# Patient Record
Sex: Female | Born: 1940 | Hispanic: No | Marital: Married | Smoking: Former smoker
Health system: Southern US, Community
[De-identification: ages and names within clinical notes are randomized; demographics above are authoritative.]

## PROBLEM LIST (undated history)

## (undated) DIAGNOSIS — I10 Essential (primary) hypertension: Secondary | ICD-10-CM

## (undated) DIAGNOSIS — E119 Type 2 diabetes mellitus without complications: Secondary | ICD-10-CM

---

## 2011-04-02 ENCOUNTER — Encounter: Payer: Self-pay | Admitting: Emergency Medicine

## 2011-04-02 ENCOUNTER — Emergency Department (HOSPITAL_COMMUNITY)
Admission: EM | Admit: 2011-04-02 | Discharge: 2011-04-02 | Disposition: A | Discharge status: Home / Self Care | Payer: Medicaid Other | Attending: Emergency Medicine | Admitting: Emergency Medicine

## 2011-04-02 DIAGNOSIS — F17200 Nicotine dependence, unspecified, uncomplicated: Secondary | ICD-10-CM | POA: Insufficient documentation

## 2011-04-02 DIAGNOSIS — R197 Diarrhea, unspecified: Secondary | ICD-10-CM | POA: Insufficient documentation

## 2011-04-02 LAB — DIFFERENTIAL
Eosinophils Absolute: 0.2 10*3/uL (ref 0.0–0.7)
Eosinophils Relative: 2 % (ref 0–5)
Lymphocytes Relative: 15 % (ref 12–46)
Lymphs Abs: 1.5 10*3/uL (ref 0.7–4.0)
Monocytes Absolute: 1.1 10*3/uL — ABNORMAL HIGH (ref 0.1–1.0)
Monocytes Relative: 11 % (ref 3–12)

## 2011-04-02 LAB — COMPREHENSIVE METABOLIC PANEL
ALT: 41 U/L — ABNORMAL HIGH (ref 0–35)
BUN: 22 mg/dL (ref 6–23)
CO2: 27 mEq/L (ref 19–32)
Calcium: 9.4 mg/dL (ref 8.4–10.5)
Creatinine, Ser: 0.67 mg/dL (ref 0.50–1.10)
GFR calc Af Amer: >90 mL/min (ref >90)
GFR calc non Af Amer: 88 mL/min — ABNORMAL LOW (ref >90)
Glucose, Bld: 113 mg/dL — ABNORMAL HIGH (ref 70–99)
Total Protein: 7.6 g/dL (ref 6.0–8.3)

## 2011-04-02 LAB — URINALYSIS, ROUTINE W REFLEX MICROSCOPIC
Hgb urine dipstick: NEGATIVE
Nitrite: NEGATIVE
Protein, ur: NEGATIVE mg/dL
Specific Gravity, Urine: 1.021 (ref 1.005–1.030)
Urobilinogen, UA: 0.2 mg/dL (ref 0.0–1.0)

## 2011-04-02 LAB — CBC
HCT: 40.9 % (ref 36.0–46.0)
MCH: 29.5 pg (ref 26.0–34.0)
MCV: 87.4 fL (ref 78.0–100.0)
Platelets: 265 10*3/uL (ref 150–400)
RBC: 4.68 MIL/uL (ref 3.87–5.11)
WBC: 9.7 10*3/uL (ref 4.0–10.5)

## 2011-04-02 LAB — LACTIC ACID, PLASMA: Lactic Acid, Venous: 1.6 mmol/L (ref 0.5–2.2)

## 2011-04-02 LAB — URINE MICROSCOPIC-ADD ON

## 2011-04-02 LAB — LIPASE, BLOOD: Lipase: 50 U/L (ref 11–59)

## 2011-04-02 MED ORDER — MORPHINE SULFATE 4 MG/ML IJ SOLN
4.0000 mg | Freq: Once | INTRAMUSCULAR | Status: AC
  Administered 2011-04-02: 4 mg via INTRAVENOUS
  Filled 2011-04-02: qty 1

## 2011-04-02 MED ORDER — SODIUM CHLORIDE 0.9 % IV SOLN
INTRAVENOUS | Status: DC
  Administered 2011-04-02: 13:00:00 via INTRAVENOUS

## 2011-04-02 MED ORDER — LOPERAMIDE HCL 2 MG PO CAPS: 2.0000 mg | ORAL_CAPSULE | Freq: Four times a day (QID) | ORAL | Status: AC | PRN

## 2011-04-02 NOTE — ED Provider Notes (Signed)
History     CSN: [id number] Arrival date & time: 04/02/2011 11:54 AM   First MD Initiated Contact with Patient 04/02/11 1242      Chief Complaint  Patient presents with  . Diarrhea    (Consider location/radiation/quality/duration/timing/severity/associated sxs/prior treatment) Patient is a 70 y.o. female presenting with diarrhea. The history is provided by the patient. No language interpreter was used.  Diarrhea The primary symptoms include abdominal pain (mild) and diarrhea. Primary symptoms do not include fever, fatigue, nausea, vomiting, melena, hematochezia or dysuria. The illness began 3 to 5 days ago. The onset was gradual. The problem has not changed since onset. The illness does not include chills, anorexia, constipation or back pain.    History reviewed. No pertinent past medical history.  History reviewed. No pertinent past surgical history.  History reviewed. No pertinent family history.  History  Substance Use Topics  . Smoking status: Current Some Day Smoker  . Smokeless tobacco: Not on file  . Alcohol Use: No    OB History    Grav Para Term Preterm Abortions TAB SAB Ect Mult Living                  Review of Systems  Constitutional: Negative for fever, chills, activity change, appetite change and fatigue.  HENT: Negative for congestion, sore throat, rhinorrhea, neck pain and neck stiffness.   Respiratory: Negative for cough and shortness of breath.   Cardiovascular: Negative for chest pain and palpitations.  Gastrointestinal: Positive for abdominal pain (mild) and diarrhea. Negative for nausea, vomiting, constipation, melena, hematochezia and anorexia.  Genitourinary: Negative for dysuria, urgency, frequency and flank pain.  Musculoskeletal: Negative for back pain.  Neurological: Negative for dizziness, syncope, weakness, light-headedness, numbness and headaches.  All other systems reviewed and are negative.    Allergies  Review of patient's  allergies indicates no known allergies.  Home Medications   Current Outpatient Rx  Name Route Sig Dispense Refill  . LOPERAMIDE HCL 2 MG PO CAPS Oral Take 1 capsule (2 mg total) by mouth 4 (four) times daily as needed for diarrhea/loose stools. 12 capsule 0    BP 125/67  Pulse 95  Temp(Src) 98.5 F (36.9 C) (Oral)  Resp 20  SpO2 97%  Physical Exam  Nursing note and vitals reviewed. Constitutional: She is oriented to person, place, and time. She appears well-developed and well-nourished. No distress.  HENT:  Head: Normocephalic and atraumatic.  Mouth/Throat: Oropharynx is clear and moist.  Eyes: Conjunctivae and EOM are normal. Pupils are equal, round, and reactive to light.  Neck: Normal range of motion. Neck supple.  Cardiovascular: Normal rate, regular rhythm, normal heart sounds and intact distal pulses.  Exam reveals no gallop and no friction rub.   No murmur heard. Pulmonary/Chest: Effort normal and breath sounds normal. No respiratory distress.  Abdominal: Soft. Bowel sounds are normal. There is no tenderness. There is no rebound and no guarding.  Musculoskeletal: Normal range of motion. She exhibits no tenderness.  Neurological: She is alert and oriented to person, place, and time.  Skin: Skin is warm and dry.    ED Course  Procedures (including critical care time)  Labs Reviewed  DIFFERENTIAL - Abnormal; Notable for the following:    Monocytes Absolute 1.1 (*)    All other components within normal limits  COMPREHENSIVE METABOLIC PANEL - Abnormal; Notable for the following:    Glucose, Bld 113 (*)    AST 42 (*)    ALT 41 (*)  GFR calc non Af Amer 88 (*)    All other components within normal limits  URINALYSIS, ROUTINE W REFLEX MICROSCOPIC - Abnormal; Notable for the following:    Appearance CLOUDY (*)    Leukocytes, UA MODERATE (*)    All other components within normal limits  URINE MICROSCOPIC-ADD ON - Abnormal; Notable for the following:    Squamous  Epithelial / LPF MANY (*)    All other components within normal limits  CBC  LIPASE, BLOOD  LACTIC ACID, PLASMA   No results found.   1. Diarrhea       MDM  Laboratory studies are relatively unremarkable. She is well hydrated. No evidence of urinary tract infection. She is no recent travel. There is no indication to treat with antibiotics. She has no blood in stool. Will discharge her home with instructions to followup with her primary care physician with a prescription for loperamide. Is not on recent antibiotics for a concern about C. difficile        Dayton Bailiff, MD 04/02/11 [phone]

## 2011-04-02 NOTE — ED Notes (Signed)
Pt reports diarrhea and stomach cramping x 3 days.  Pt denies N/V or fever. Pt speaks [place].

## 2011-10-18 ENCOUNTER — Emergency Department (HOSPITAL_COMMUNITY)
Admission: EM | Admit: 2011-10-18 | Discharge: 2011-10-19 | Disposition: A | Discharge status: Home / Self Care | Payer: Medicaid Other | Attending: Emergency Medicine | Admitting: Emergency Medicine

## 2011-10-18 ENCOUNTER — Encounter (HOSPITAL_COMMUNITY): Payer: Self-pay | Admitting: *Deleted

## 2011-10-18 DIAGNOSIS — F17200 Nicotine dependence, unspecified, uncomplicated: Secondary | ICD-10-CM | POA: Insufficient documentation

## 2011-10-18 DIAGNOSIS — K029 Dental caries, unspecified: Secondary | ICD-10-CM

## 2011-10-18 DIAGNOSIS — K089 Disorder of teeth and supporting structures, unspecified: Secondary | ICD-10-CM | POA: Insufficient documentation

## 2011-10-18 NOTE — ED Notes (Signed)
The pt has had a toothache for several days.  She speaks [place]

## 2011-10-19 MED ORDER — AMOXICILLIN 500 MG PO CAPS: 500.0000 mg | ORAL_CAPSULE | Freq: Three times a day (TID) | ORAL | Status: AC

## 2011-10-19 MED ORDER — AMOXICILLIN 500 MG PO CAPS
500.0000 mg | ORAL_CAPSULE | Freq: Once | ORAL | Status: AC
  Administered 2011-10-19: 500 mg via ORAL
  Filled 2011-10-19: qty 1

## 2011-10-19 MED ORDER — HYDROCODONE-ACETAMINOPHEN 5-325 MG PO TABS
1.0000 | ORAL_TABLET | Freq: Once | ORAL | Status: AC
  Administered 2011-10-19: 1 via ORAL
  Filled 2011-10-19: qty 1

## 2011-10-19 MED ORDER — HYDROCODONE-ACETAMINOPHEN 5-325 MG PO TABS: 1.0000 | ORAL_TABLET | ORAL | Status: AC | PRN

## 2011-10-19 NOTE — ED Provider Notes (Signed)
History     CSN: [id number]  Arrival date & time 10/18/11  2125   First MD Initiated Contact with Patient 10/18/11 2344      Chief Complaint  Patient presents with  . Dental Pain    (Consider location/radiation/quality/duration/timing/severity/associated sxs/prior treatment) HPI Comments: Patient is from [place] - here with acute onset of dental pain - she points to multiple teeth when asked which one hurts - they are all discolored and decayed with signs of chronic wear.  Marked gum swelling noted.    Patient is a 71 y.o. female presenting with tooth pain. The history is provided by the patient. The history is limited by a language barrier. No language interpreter was used (daughter speaks english).  Dental PainThe primary symptoms include mouth pain. Primary symptoms do not include dental injury, oral bleeding, oral lesions, headaches, fever, shortness of breath, sore throat, angioedema or cough. The symptoms began 3 to 5 days ago. The symptoms are worsening. The symptoms are new. The symptoms occur constantly.  Additional symptoms include: dental sensitivity to temperature, gum swelling and gum tenderness. Additional symptoms do not include: purulent gums, trismus, jaw pain, facial swelling, trouble swallowing, pain with swallowing, excessive salivation, dry mouth, taste disturbance, smell disturbance, drooling, ear pain, hearing loss, nosebleeds, swollen glands, goiter and fatigue. Medical issues include: smoking and periodontal disease.    History reviewed. No pertinent past medical history.  History reviewed. No pertinent past surgical history.  No family history on file.  History  Substance Use Topics  . Smoking status: Current Some Day Smoker  . Smokeless tobacco: Not on file  . Alcohol Use: No    OB History    Grav Para Term Preterm Abortions TAB SAB Ect Mult Living                  Review of Systems  Constitutional: Negative for fever and fatigue.  HENT: Negative for  hearing loss, ear pain, nosebleeds, sore throat, facial swelling, drooling and trouble swallowing.   Respiratory: Negative for cough and shortness of breath.   Neurological: Negative for headaches.  All other systems reviewed and are negative.    Allergies  Review of patient's allergies indicates no known allergies.  Home Medications  No current outpatient prescriptions on file.  BP 150/77  Pulse 86  Temp(Src) 99 F (37.2 C) (Oral)  Resp 20  SpO2 97%  Physical Exam  Nursing note and vitals reviewed. Constitutional: She is oriented to person, place, and time. She appears well-developed and well-nourished. No distress.  HENT:  Head: Normocephalic and atraumatic.  Right Ear: External ear normal.  Left Ear: External ear normal.  Nose: Nose normal.  Mouth/Throat: No oropharyngeal exudate.       Widespread dental decay and wear - gingivitis - gum swelling without fluctuance noted to left upper 3rd molar  Eyes: Conjunctivae are normal. Pupils are equal, round, and reactive to light. No scleral icterus.  Neck: Normal range of motion. Neck supple.  Cardiovascular: Normal rate, regular rhythm and normal heart sounds.  Exam reveals no gallop and no friction rub.   No murmur heard. Pulmonary/Chest: Effort normal and breath sounds normal. No respiratory distress. She has no wheezes. She has no rales. She exhibits no tenderness.  Abdominal: Soft. Bowel sounds are normal. She exhibits no distension. There is no tenderness.  Musculoskeletal: Normal range of motion. She exhibits no edema and no tenderness.  Lymphadenopathy:    She has no cervical adenopathy.  Neurological: She is alert and  oriented to person, place, and time. No cranial nerve deficit. She exhibits normal muscle tone. Coordination normal.  Skin: Skin is warm and dry. No rash noted. No erythema. No pallor.  Psychiatric: She has a normal mood and affect. Her behavior is normal. Judgment and thought content normal.    ED  Course  Procedures (including critical care time)  Labs Reviewed - No data to display No results found.   Dental decay   MDM  Patient here with marked dental decay and peridontal disease - will place on short course pain medication and follow up with dentist.        Izola Price. [place], [place] 10/19/11 [phone]

## 2011-10-19 NOTE — ED Provider Notes (Signed)
Medical screening examination/treatment/procedure(s) were performed by non-physician practitioner and as supervising physician I was immediately available for consultation/collaboration.   Sunnie Nielsen, MD 10/19/11 [phone]

## 2012-02-12 ENCOUNTER — Encounter (HOSPITAL_COMMUNITY): Payer: Self-pay | Admitting: Emergency Medicine

## 2012-02-12 ENCOUNTER — Inpatient Hospital Stay (HOSPITAL_COMMUNITY)
Admission: EM | Admit: 2012-02-12 | Discharge: 2012-02-15 | DRG: 189 | Disposition: A | Discharge status: Home / Self Care | Payer: Medicaid Other | Attending: Internal Medicine | Admitting: Internal Medicine

## 2012-02-12 ENCOUNTER — Emergency Department (HOSPITAL_COMMUNITY): Payer: Medicaid Other

## 2012-02-12 DIAGNOSIS — R0789 Other chest pain: Secondary | ICD-10-CM | POA: Diagnosis present

## 2012-02-12 DIAGNOSIS — J449 Chronic obstructive pulmonary disease, unspecified: Secondary | ICD-10-CM | POA: Diagnosis present

## 2012-02-12 DIAGNOSIS — J9601 Acute respiratory failure with hypoxia: Secondary | ICD-10-CM

## 2012-02-12 DIAGNOSIS — J9600 Acute respiratory failure, unspecified whether with hypoxia or hypercapnia: Principal | ICD-10-CM

## 2012-02-12 DIAGNOSIS — J209 Acute bronchitis, unspecified: Secondary | ICD-10-CM

## 2012-02-12 DIAGNOSIS — E872 Acidosis, unspecified: Secondary | ICD-10-CM | POA: Diagnosis present

## 2012-02-12 DIAGNOSIS — R079 Chest pain, unspecified: Secondary | ICD-10-CM

## 2012-02-12 DIAGNOSIS — K259 Gastric ulcer, unspecified as acute or chronic, without hemorrhage or perforation: Secondary | ICD-10-CM

## 2012-02-12 DIAGNOSIS — E119 Type 2 diabetes mellitus without complications: Secondary | ICD-10-CM | POA: Diagnosis present

## 2012-02-12 DIAGNOSIS — K7689 Other specified diseases of liver: Secondary | ICD-10-CM | POA: Diagnosis present

## 2012-02-12 DIAGNOSIS — Z720 Tobacco use: Secondary | ICD-10-CM

## 2012-02-12 DIAGNOSIS — R0902 Hypoxemia: Secondary | ICD-10-CM

## 2012-02-12 DIAGNOSIS — R739 Hyperglycemia, unspecified: Secondary | ICD-10-CM | POA: Diagnosis present

## 2012-02-12 DIAGNOSIS — R1013 Epigastric pain: Secondary | ICD-10-CM | POA: Diagnosis present

## 2012-02-12 DIAGNOSIS — R109 Unspecified abdominal pain: Secondary | ICD-10-CM

## 2012-02-12 DIAGNOSIS — IMO0001 Reserved for inherently not codable concepts without codable children: Secondary | ICD-10-CM

## 2012-02-12 DIAGNOSIS — J440 Chronic obstructive pulmonary disease with acute lower respiratory infection: Secondary | ICD-10-CM | POA: Diagnosis present

## 2012-02-12 DIAGNOSIS — F17200 Nicotine dependence, unspecified, uncomplicated: Secondary | ICD-10-CM | POA: Diagnosis present

## 2012-02-12 DIAGNOSIS — K219 Gastro-esophageal reflux disease without esophagitis: Secondary | ICD-10-CM | POA: Diagnosis present

## 2012-02-12 DIAGNOSIS — R7309 Other abnormal glucose: Secondary | ICD-10-CM

## 2012-02-12 HISTORY — DX: Type 2 diabetes mellitus without complications: E11.9

## 2012-02-12 HISTORY — DX: Essential (primary) hypertension: I10

## 2012-02-12 LAB — BASIC METABOLIC PANEL
BUN: 16 mg/dL (ref 6–23)
CO2: 25 mEq/L (ref 19–32)
Calcium: 9.6 mg/dL (ref 8.4–10.5)
Chloride: 97 mEq/L (ref 96–112)
Creatinine, Ser: 0.61 mg/dL (ref 0.50–1.10)

## 2012-02-12 LAB — CBC
HCT: 39.1 % (ref 36.0–46.0)
MCH: 28.6 pg (ref 26.0–34.0)
MCV: 84.8 fL (ref 78.0–100.0)
Platelets: 271 10*3/uL (ref 150–400)
RBC: 4.61 MIL/uL (ref 3.87–5.11)
RDW: 13.7 % (ref 11.5–15.5)
WBC: 12.4 10*3/uL — ABNORMAL HIGH (ref 4.0–10.5)

## 2012-02-12 LAB — HEPATIC FUNCTION PANEL
AST: 25 U/L (ref 0–37)
Albumin: 3.9 g/dL (ref 3.5–5.2)
Alkaline Phosphatase: 110 U/L (ref 39–117)
Bilirubin, Direct: <0.1 mg/dL (ref 0.0–0.3)
Total Bilirubin: 0.2 mg/dL — ABNORMAL LOW (ref 0.3–1.2)

## 2012-02-12 MED ORDER — GI COCKTAIL ~~LOC~~
30.0000 mL | Freq: Once | ORAL | Status: AC
  Administered 2012-02-12: 30 mL via ORAL
  Filled 2012-02-12: qty 30

## 2012-02-12 MED ORDER — ASPIRIN 325 MG PO TABS
325.0000 mg | ORAL_TABLET | ORAL | Status: AC
  Administered 2012-02-12: 325 mg via ORAL
  Filled 2012-02-12: qty 1

## 2012-02-12 MED ORDER — FAMOTIDINE IN NACL 20-0.9 MG/50ML-% IV SOLN
20.0000 mg | Freq: Once | INTRAVENOUS | Status: AC
  Administered 2012-02-12: 20 mg via INTRAVENOUS
  Filled 2012-02-12: qty 50

## 2012-02-12 NOTE — ED Notes (Addendum)
Pt speaks [place], pt's daughter in law at bedside translating; interpreter phones at bedside if needed Reports epigastric pain since yesterday described as burning, denies SOB, n/v;

## 2012-02-12 NOTE — ED Provider Notes (Signed)
History     CSN: [id number]  Arrival date & time 02/12/12  4098   First MD Initiated Contact with Patient 02/12/12 2210      Chief Complaint  Patient presents with  . Chest Pain    (Consider location/radiation/quality/duration/timing/severity/associated sxs/prior treatment) Patient is a 71 y.o. female presenting with chest pain. The history is provided by the patient and a relative. The history is limited by a language barrier.  Chest Pain Pertinent negatives for primary symptoms include no fever, no shortness of breath, no abdominal pain, no nausea and no vomiting.    71 y.o.female INAD accompanied by daughter c/o severe burning sensation to epigastrium onset several hours ago. Denies fever, cough, N/V, change in bowel or bladder habits.   History reviewed. No pertinent past medical history.  History reviewed. No pertinent past surgical history.  History reviewed. No pertinent family history.  History  Substance Use Topics  . Smoking status: Current Some Day Smoker  . Smokeless tobacco: Not on file  . Alcohol Use: No    OB History    Grav Para Term Preterm Abortions TAB SAB Ect Mult Living                  Review of Systems  Constitutional: Negative for fever.  Respiratory: Negative for shortness of breath.   Cardiovascular: Positive for chest pain.  Gastrointestinal: Negative for nausea, vomiting, abdominal pain and diarrhea.  All other systems reviewed and are negative.    Allergies  Review of patient's allergies indicates no known allergies.  Home Medications  No current outpatient prescriptions on file.  BP 178/93  Pulse 96  Temp 97.7 F (36.5 C) (Oral)  Resp 16  SpO2 98%  Physical Exam  Nursing note and vitals reviewed. Constitutional: She is oriented to person, place, and time. She appears well-developed and well-nourished. No distress.  HENT:  Head: Normocephalic and atraumatic.  Eyes: Conjunctivae normal and EOM are normal. Pupils are  equal, round, and reactive to light.  Neck: Normal range of motion. No JVD present.  Cardiovascular: Normal rate, regular rhythm and intact distal pulses.   Pulmonary/Chest: Effort normal and breath sounds normal. No stridor. No respiratory distress. She has no wheezes. She has no rales. She exhibits no tenderness.  Abdominal: Soft. Bowel sounds are normal. She exhibits no distension and no mass. There is tenderness. There is no rebound and no guarding.       Mild tenderness to deep palpation of the epigastric region  Musculoskeletal: Normal range of motion. She exhibits no edema and no tenderness.  Neurological: She is alert and oriented to person, place, and time.  Skin: Skin is warm.  Psychiatric: She has a normal mood and affect.    ED Course  Procedures (including critical care time)  Labs Reviewed  CBC - Abnormal; Notable for the following:    WBC 12.4 (*)     All other components within normal limits  BASIC METABOLIC PANEL - Abnormal; Notable for the following:    Sodium 133 (*)     Glucose, Bld 252 (*)     All other components within normal limits  HEPATIC FUNCTION PANEL - Abnormal; Notable for the following:    Total Bilirubin 0.2 (*)     All other components within normal limits  LIPASE, BLOOD - Abnormal; Notable for the following:    Lipase 66 (*)     All other components within normal limits  POCT I-STAT TROPONIN I   Dg Chest 2  View  02/12/2012  *RADIOLOGY REPORT*  Clinical Data: Chest pain  CHEST - 2 VIEW  Comparison: None.  Findings: The lungs are clear without focal infiltrate, edema, pneumothorax or pleural effusion. Interstitial markings are diffusely coarsened with chronic features. The cardiopericardial silhouette is enlarged.  Imaged bony structures of the thorax are intact.  IMPRESSION: Cardiomegaly with chronic interstitial coarsening.  No acute cardiopulmonary process.   Original Report Authenticated By: ERIC A. MANSELL, M.D.     Date: 02/13/2012  Rate: 97   Rhythm: normal sinus rhythm  QRS Axis: left  Intervals: normal  ST/T Wave abnormalities: nonspecific ST/T changes  Conduction Disutrbances:none  Narrative Interpretation:   Old EKG Reviewed: none available  1. Chest pain   2. Elevated blood pressure   3. Elevated random blood glucose level     MDM  Likely gastric process however, patient's blood pressure is elevated and glucose is also elevated. Considering her age and likely diabetes and hypertension she has several risk factors for cardiac process I think it is reasonable at this point to admit her for observation to the CDU on a chest pain protocol.  EKG is nonischemic, troponin is negative. Patient has an elevated blood glucose at 252. I will bolus her 1 L of normal saline.   Chest x-ray shows cardiomegaly, with no acute processes.   Patient has a mild leukocytosis at 12.4.  Pain is significantly improved after GI cocktail and Pepcid. Is not limited however I plan on admitting her to the CDU for chest pain protocol therefore I will give her morphine and Zofran to a limit pain before transfer.  Pt is pain free and eligible for CDU transfer for coronary CTA in the a.m.  Signout given to DR. Wickline at shift change.      Wynetta Emery, PA-C 02/13/12 0031  Wynetta Emery, PA-C 02/13/12 1610

## 2012-02-13 ENCOUNTER — Observation Stay (HOSPITAL_COMMUNITY): Payer: Medicaid Other

## 2012-02-13 ENCOUNTER — Encounter (HOSPITAL_COMMUNITY): Payer: Self-pay | Admitting: Radiology

## 2012-02-13 ENCOUNTER — Inpatient Hospital Stay (HOSPITAL_COMMUNITY): Payer: Medicaid Other

## 2012-02-13 DIAGNOSIS — J209 Acute bronchitis, unspecified: Secondary | ICD-10-CM | POA: Diagnosis present

## 2012-02-13 DIAGNOSIS — R0902 Hypoxemia: Secondary | ICD-10-CM | POA: Insufficient documentation

## 2012-02-13 DIAGNOSIS — R109 Unspecified abdominal pain: Secondary | ICD-10-CM

## 2012-02-13 DIAGNOSIS — J9601 Acute respiratory failure with hypoxia: Secondary | ICD-10-CM | POA: Diagnosis present

## 2012-02-13 DIAGNOSIS — J9600 Acute respiratory failure, unspecified whether with hypoxia or hypercapnia: Principal | ICD-10-CM | POA: Insufficient documentation

## 2012-02-13 DIAGNOSIS — Z720 Tobacco use: Secondary | ICD-10-CM | POA: Diagnosis present

## 2012-02-13 DIAGNOSIS — R079 Chest pain, unspecified: Secondary | ICD-10-CM | POA: Insufficient documentation

## 2012-02-13 DIAGNOSIS — R739 Hyperglycemia, unspecified: Secondary | ICD-10-CM | POA: Diagnosis present

## 2012-02-13 HISTORY — DX: Unspecified abdominal pain: R10.9

## 2012-02-13 LAB — POCT I-STAT 3, ART BLOOD GAS (G3+)
Acid-base deficit: 2 mmol/L (ref 0.0–2.0)
Bicarbonate: 25.6 mEq/L — ABNORMAL HIGH (ref 20.0–24.0)
O2 Saturation: 85 %
pO2, Arterial: 57 mmHg — ABNORMAL LOW (ref 80.0–100.0)

## 2012-02-13 LAB — CBC
HCT: 38.7 % (ref 36.0–46.0)
MCV: 86 fL (ref 78.0–100.0)
Platelets: 256 10*3/uL (ref 150–400)
RBC: 4.5 MIL/uL (ref 3.87–5.11)
WBC: 10.5 10*3/uL (ref 4.0–10.5)

## 2012-02-13 LAB — BLOOD GAS, ARTERIAL
Acid-base deficit: 0.9 mmol/L (ref 0.0–2.0)
Bicarbonate: 24.5 mEq/L — ABNORMAL HIGH (ref 20.0–24.0)
TCO2: 26.1 mmol/L (ref 0–100)
pCO2 arterial: 50.3 mmHg — ABNORMAL HIGH (ref 35.0–45.0)
pO2, Arterial: 165 mmHg — ABNORMAL HIGH (ref 80.0–100.0)

## 2012-02-13 LAB — MRSA PCR SCREENING: MRSA by PCR: NEGATIVE

## 2012-02-13 LAB — GLUCOSE, CAPILLARY: Glucose-Capillary: 107 mg/dL — ABNORMAL HIGH (ref 70–99)

## 2012-02-13 LAB — CK TOTAL AND CKMB (NOT AT ARMC)
CK, MB: 2 ng/mL (ref 0.3–4.0)
Relative Index: INVALID (ref 0.0–2.5)
Total CK: 97 U/L (ref 7–177)

## 2012-02-13 LAB — CREATININE, SERUM: GFR calc Af Amer: >90 mL/min (ref >90)

## 2012-02-13 LAB — TROPONIN I: Troponin I: <0.3 ng/mL (ref <0.30)

## 2012-02-13 MED ORDER — HYDROCODONE-ACETAMINOPHEN 5-325 MG PO TABS
1.0000 | ORAL_TABLET | Freq: Four times a day (QID) | ORAL | Status: DC | PRN
  Administered 2012-02-13 – 2012-02-14 (×2): 1 via ORAL
  Filled 2012-02-13 (×2): qty 1

## 2012-02-13 MED ORDER — ACETAMINOPHEN 325 MG PO TABS: 650.0000 mg | ORAL_TABLET | Freq: Four times a day (QID) | ORAL | Status: DC | PRN

## 2012-02-13 MED ORDER — ALUM & MAG HYDROXIDE-SIMETH 200-200-20 MG/5ML PO SUSP
30.0000 mL | Freq: Four times a day (QID) | ORAL | Status: DC | PRN
  Filled 2012-02-13: qty 30

## 2012-02-13 MED ORDER — ONDANSETRON HCL 4 MG/2ML IJ SOLN
4.0000 mg | Freq: Once | INTRAMUSCULAR | Status: AC
  Administered 2012-02-13: 4 mg via INTRAVENOUS
  Filled 2012-02-13: qty 2

## 2012-02-13 MED ORDER — GUAIFENESIN-DM 100-10 MG/5ML PO SYRP
5.0000 mL | ORAL_SOLUTION | ORAL | Status: DC | PRN
  Filled 2012-02-13: qty 5

## 2012-02-13 MED ORDER — LEVOFLOXACIN IN D5W 750 MG/150ML IV SOLN
750.0000 mg | INTRAVENOUS | Status: DC
  Administered 2012-02-13: 750 mg via INTRAVENOUS
  Filled 2012-02-13 (×2): qty 150

## 2012-02-13 MED ORDER — BIOTENE DRY MOUTH MT LIQD
15.0000 mL | Freq: Two times a day (BID) | OROMUCOSAL | Status: DC
  Administered 2012-02-13 – 2012-02-15 (×3): 15 mL via OROMUCOSAL

## 2012-02-13 MED ORDER — IPRATROPIUM BROMIDE 0.02 % IN SOLN
0.5000 mg | Freq: Once | RESPIRATORY_TRACT | Status: AC
  Administered 2012-02-13: 0.5 mg via RESPIRATORY_TRACT
  Filled 2012-02-13: qty 2.5

## 2012-02-13 MED ORDER — HYDROMORPHONE HCL PF 1 MG/ML IJ SOLN: 1.0000 mg | INTRAMUSCULAR | Status: DC | PRN

## 2012-02-13 MED ORDER — METOPROLOL TARTRATE 25 MG PO TABS
50.0000 mg | ORAL_TABLET | Freq: Once | ORAL | Status: AC
Start: 2012-02-13 — End: 2012-02-13
  Administered 2012-02-13: 50 mg via ORAL
  Filled 2012-02-13: qty 2

## 2012-02-13 MED ORDER — IPRATROPIUM BROMIDE 0.02 % IN SOLN
0.5000 mg | RESPIRATORY_TRACT | Status: DC
  Administered 2012-02-13 (×2): 0.5 mg via RESPIRATORY_TRACT
  Filled 2012-02-13 (×2): qty 2.5

## 2012-02-13 MED ORDER — ALBUTEROL SULFATE (5 MG/ML) 0.5% IN NEBU: 2.5000 mg | INHALATION_SOLUTION | RESPIRATORY_TRACT | Status: DC | PRN

## 2012-02-13 MED ORDER — SODIUM CHLORIDE 0.9 % IJ SOLN
3.0000 mL | Freq: Two times a day (BID) | INTRAMUSCULAR | Status: DC
  Administered 2012-02-13 – 2012-02-15 (×3): 3 mL via INTRAVENOUS

## 2012-02-13 MED ORDER — SODIUM CHLORIDE 0.9 % IV SOLN: 250.0000 mL | INTRAVENOUS | Status: DC | PRN

## 2012-02-13 MED ORDER — NICOTINE 21 MG/24HR TD PT24
21.0000 mg | MEDICATED_PATCH | Freq: Every day | TRANSDERMAL | Status: DC
  Administered 2012-02-13 – 2012-02-15 (×3): 21 mg via TRANSDERMAL
  Filled 2012-02-13 (×3): qty 1

## 2012-02-13 MED ORDER — ENOXAPARIN SODIUM 40 MG/0.4ML ~~LOC~~ SOLN
40.0000 mg | SUBCUTANEOUS | Status: DC
  Administered 2012-02-13 – 2012-02-14 (×2): 40 mg via SUBCUTANEOUS
  Filled 2012-02-13 (×4): qty 0.4

## 2012-02-13 MED ORDER — ACETAMINOPHEN 650 MG RE SUPP: 650.0000 mg | Freq: Four times a day (QID) | RECTAL | Status: DC | PRN

## 2012-02-13 MED ORDER — IOHEXOL 350 MG/ML SOLN
100.0000 mL | Freq: Once | INTRAVENOUS | Status: AC | PRN
  Administered 2012-02-13: 100 mL via INTRAVENOUS

## 2012-02-13 MED ORDER — IPRATROPIUM BROMIDE 0.02 % IN SOLN: 0.5000 mg | Freq: Four times a day (QID) | RESPIRATORY_TRACT | Status: DC

## 2012-02-13 MED ORDER — ONDANSETRON HCL 4 MG/2ML IJ SOLN: 4.0000 mg | Freq: Four times a day (QID) | INTRAMUSCULAR | Status: DC | PRN

## 2012-02-13 MED ORDER — SODIUM CHLORIDE 0.9 % IJ SOLN: 3.0000 mL | INTRAMUSCULAR | Status: DC | PRN

## 2012-02-13 MED ORDER — SODIUM CHLORIDE 0.9 % IV SOLN
INTRAVENOUS | Status: AC
  Administered 2012-02-13: 75 mL/h via INTRAVENOUS

## 2012-02-13 MED ORDER — ONDANSETRON HCL 4 MG PO TABS: 4.0000 mg | ORAL_TABLET | Freq: Four times a day (QID) | ORAL | Status: DC | PRN

## 2012-02-13 MED ORDER — MORPHINE SULFATE 4 MG/ML IJ SOLN
4.0000 mg | Freq: Once | INTRAMUSCULAR | Status: AC
  Administered 2012-02-13: 4 mg via INTRAVENOUS
  Filled 2012-02-13: qty 1

## 2012-02-13 MED ORDER — PANTOPRAZOLE SODIUM 40 MG PO TBEC
40.0000 mg | DELAYED_RELEASE_TABLET | Freq: Every day | ORAL | Status: DC
  Administered 2012-02-14 – 2012-02-15 (×2): 40 mg via ORAL
  Filled 2012-02-13 (×2): qty 1

## 2012-02-13 MED ORDER — METHYLPREDNISOLONE SODIUM SUCC 125 MG IJ SOLR
60.0000 mg | Freq: Four times a day (QID) | INTRAMUSCULAR | Status: DC
  Administered 2012-02-13 – 2012-02-14 (×4): 60 mg via INTRAVENOUS
  Filled 2012-02-13 (×7): qty 0.96

## 2012-02-13 MED ORDER — BUDESONIDE 0.25 MG/2ML IN SUSP
0.2500 mg | Freq: Two times a day (BID) | RESPIRATORY_TRACT | Status: DC
  Administered 2012-02-13: 0.25 mg via RESPIRATORY_TRACT
  Filled 2012-02-13 (×5): qty 2

## 2012-02-13 MED ORDER — ALBUTEROL SULFATE (5 MG/ML) 0.5% IN NEBU
5.0000 mg | INHALATION_SOLUTION | RESPIRATORY_TRACT | Status: DC
  Administered 2012-02-13 (×2): 5 mg via RESPIRATORY_TRACT
  Filled 2012-02-13 (×3): qty 0.5

## 2012-02-13 MED ORDER — METHYLPREDNISOLONE SODIUM SUCC 125 MG IJ SOLR
125.0000 mg | Freq: Once | INTRAMUSCULAR | Status: AC
  Administered 2012-02-13: 125 mg via INTRAVENOUS
  Filled 2012-02-13: qty 2

## 2012-02-13 MED ORDER — ALBUTEROL SULFATE (5 MG/ML) 0.5% IN NEBU
5.0000 mg | INHALATION_SOLUTION | Freq: Once | RESPIRATORY_TRACT | Status: AC
  Administered 2012-02-13: 5 mg via RESPIRATORY_TRACT
  Filled 2012-02-13: qty 1

## 2012-02-13 MED ORDER — METOPROLOL TARTRATE 25 MG PO TABS
50.0000 mg | ORAL_TABLET | Freq: Once | ORAL | Status: AC
  Administered 2012-02-13: 50 mg via ORAL
  Filled 2012-02-13: qty 2

## 2012-02-13 MED ORDER — METOPROLOL TARTRATE 25 MG PO TABS
100.0000 mg | ORAL_TABLET | Freq: Once | ORAL | Status: AC
  Administered 2012-02-13: 100 mg via ORAL
  Filled 2012-02-13: qty 4

## 2012-02-13 NOTE — H&P (Signed)
History and Physical       Hospital Admission Note Date: 02/13/2012  Patient name: Martha Gillespie Medical record number: [id number] Date of birth: 18-Aug-1940 Age: 71 y.o. Gender: female PCP: Pcp Not In System   Chief Complaint:  Shortness of breath   HPI: Patient is a 71 year old female with history of tobacco abuse, [place] speaking female accompanied with her granddaughter in the room was initially undergoing chest pain protocol in the CDU ED since yesterday. History is limited as patient does not speak [place]. Per her granddaughter, the patient was in her baseline health yesterday when she started complaining of Left lower chest/abdominal pain and was brought to the ED. She denied any fevers, chills, productive cough, vomiting or diarrhea. Subsequently while in the ED her oxygen demand was noted to be climbing up in ABG was done today after patient was on liters of oxygen which showed pH of 7.291, PCO2 53.2, PO2 of 57%and patient was placed on NRB mask. Hospitalist service was requested for admission to step down unit for acute hypoxic and hypercarbiac respiratory failure.    Review of Systems:  Limited from the patient as above due to language barrier and the translated by her granddaughter.  Past Medical History: History reviewed. No pertinent past medical history.patient's the granddaughter confirms that she is not taking any medications  History reviewed. No pertinent past surgical history.  Medications: Prior to Admission medications   Not on File  Per granddaughter, patient takes no medications  Allergies:  No Known Allergies  Social History:  reports that she has been smoking.  Per granddaughter she smokes at least 3-4 cigarettes every day. She reports that she does not drink alcohol or use illicit drugs.  Family History: History reviewed. No pertinent family history.  Physical Exam: Blood pressure 118/60, pulse  72, temperature 98.5 F (36.9 C), temperature source Oral, resp. rate 13, height 5\' 3"  (1.6 m), weight 65 kg (143 lb 4.8 oz), SpO2 96.00%. General: Alert, awake, oriented on NRB mask but able to converse with her grand daughter. HEENT: normocephalic, atraumatic, anicteric sclera, pink conjunctiva, pupils equal and reactive to light and accomodation, oropharynx clear Neck: supple, no masses or lymphadenopathy, no goiter, no bruits  Heart: Regular rate and rhythm, without murmurs, rubs or gallops. Lungs: diffuse wheezing b/l Abdomen: Soft, nondistended, positive bowel sounds, no masses, epigastric tenderness . Extremities: No clubbing, cyanosis or edema with positive pedal pulses. Neuro: Grossly intact, no focal neurological deficits, strength 5/5 upper and lower extremities bilaterally Psych: alert and oriented x 3, normal mood and affect Skin: no rashes or lesions, warm and dry   LABS on Admission:  Basic Metabolic Panel:  Lab 02/12/12 4098  NA 133*  K 4.1  CL 97  CO2 25  GLUCOSE 252*  BUN 16  CREATININE 0.61  CALCIUM 9.6  MG --  PHOS --   Liver Function Tests:  Lab 02/12/12 2030  AST 25  ALT 25  ALKPHOS 110  BILITOT 0.2*  PROT 8.1  ALBUMIN 3.9    Lab 02/12/12 2030  LIPASE 66*  AMYLASE --   No results found for this basename: AMMONIA:2 in the last 168 hours CBC:  Lab 02/12/12 2030  WBC 12.4*  NEUTROABS --  HGB 13.2  HCT 39.1  MCV 84.8  PLT 271   CBG:  Lab 02/13/12 1253  GLUCAP 107*     Radiological Exams on Admission: Dg Chest 2 View  02/12/2012 IMPRESSION: Cardiomegaly with chronic interstitial coarsening.  No acute cardiopulmonary  process.   Original Report Authenticated By: ERIC A. MANSELL, M.D.    Ct Angio Chest W/cm &/or Wo Cm  02/13/2012  IMPRESSION: No evidence of acute abnormality - no evidence of pulmonary emboli.  Upper limits of normal heart size.  Fatty infiltration of the liver.   Original Report Authenticated By: [name], M.D.      Assessment/Plan Present on Admission:   .Acute respiratory failure with hypoxia and hypercapnia:likely precipitated due to COPD exacerbation/acute bronchitis, active tobacco use  - admit to step down, obtain stat ABG, BNP, cardiac enzymes. Lactic acid normal - Patient is currently alert and awake, will likely benefit from BiPAP if ABG worse - Will place on scheduled nebs, IV Levaquin and IV Solu-Medrol - Check ECHO for any CHF, ischemic cardiomyopathy - CTA chest negative for any PE  .Tobacco abuse: - place on nicotine patch  .Bronchitis, acute: Please refer to #1  .Abdominal pain/ Atypical chest pain with mildly elevated lipase, denies any alcohol use - obtain serial cardiac enzymes x3, 2-D echocardiogram, KUB, place on PPI - for now on clear liquid diet  .Hyperglycemia: unclear if patient has a history of diabetes mellitus - Obtain hemoglobin A1c  DVT prophylaxis: lovenox  CODE STATUS:Presumed full code  Further plan will depend as patient's clinical course evolves and further radiologic and laboratory data become available.   Time Spent on Admission: 1 hour  RAI,RIPUDEEP M.D. [facility] 02/13/2012, 4:44 PM Pager: [phone]  If 7PM-7AM, please contact night-coverage [email or web address] Password [email or web address]

## 2012-02-13 NOTE — ED Provider Notes (Signed)
7:45:  Protocol Metoprolol was not given at 6:00 a.m. Discussed with Dr. Lyman Bishop who recommended giving 100 mg now and proceeding with delayed study. Patient remains pain free. Medication given - will reassess in one hour to determine if more PO Metoprolol is required.   10:45:  Patient remains comfortable, without pain. Heart rate stays about 60 with metoprolol dosing. Re-assessment shows her to be on 4L O2 and desaturation occurs with removing the oxygen to upper 80's. Review of lab studies show that she has elevated blood sugar as well, possibly indicating new-onset diabetes. Will recheck to confirm.  2:30 - She remains pain free. Diffuse rales on exam, no wheezing. Patient returned from CT off oxygen and with O2 saturations in the low 80's. Remained alert and awake. CT angio normal, no PE. Blood gas abnormal PO2 57 - Patient is heavy smoker per family, but continue to deny other medical problems. No pain. Will admit.   Rodena Medin, PA-C 02/13/12 1454

## 2012-02-13 NOTE — ED Notes (Signed)
Received report from [place], Charity fundraiser. Patient was transferred from POD A for further work up of chest pain. Patient is alert and responsive. Some wheezing present to lungs bilateral R>L. No reports of chest pain at present.  Non-english speaking from [place]. Family remains at bedside.

## 2012-02-13 NOTE — Progress Notes (Signed)
As nurse entered room; pt was eating a meal.  Pt currently on clear liquid diet, however ingested carb modified meal delivered by dietary.  Pt consumed approximately 50% of meal prior to nurse entering room.  Pt did not appear to have any complications with meal consumed.  MD paged and notified.  Nurse will continue to monitor.

## 2012-02-13 NOTE — Progress Notes (Signed)
Utilization review completed.  

## 2012-02-13 NOTE — ED Provider Notes (Signed)
Medical screening examination/treatment/procedure(s) were performed by non-physician practitioner and as supervising physician I was immediately available for consultation/collaboration.   [name] B. Bernette Mayers, MD 02/13/12 1352

## 2012-02-13 NOTE — ED Notes (Signed)
Utilized interpreter phone during pt assessment:  Pt reports epigastric pain since yesterday.  Pt stated she was playing with her grandchildren and had onset of pain which she describes as "heart pain".  She continued to point and press on her epigastric area when stating that she had heart pain.  Pt denies any other symptoms (n/v/d)

## 2012-02-13 NOTE — ED Provider Notes (Signed)
Medical screening examination/treatment/procedure(s) were performed by non-physician practitioner and as supervising physician I was immediately available for consultation/collaboration.  Deddrick Saindon, MD 02/13/12 1607 

## 2012-02-13 NOTE — Progress Notes (Signed)
Disposition Note  Annarose Droz, is a 71 y.o. female,   MRN: [id number]  -  DOB - Feb 01, 1941  Outpatient Primary MD for the patient is Pcp Not In System   Blood pressure 128/66, pulse 73, temperature 98.5 F (36.9 C), temperature source Oral, resp. rate 13, height 5\' 3"  (1.6 m), weight 65 kg (143 lb 4.8 oz), SpO2 87.00%.  Active Problems:  Hypoxia  Acute respiratory failure  Chest pain  Tobacco abuse    71 yo tobacco user came to the ED yesterday 02/12/2012 with chest pain.  She has been on the chest pain protocol in the CDU.  Over the course of the last day the ED noticed her oxygen demand was climbing an ABG was done after the patient was on 4 L of oxygen that showed a ph 7.291,  PCO2 53.2, PO2 of 57%  She is now on a non-rebreather mask with sats in the mid 90s.  I have requested a step bed for likely COPD exacerbation.  Algis Downs, PA-C [facility] Pager: [phone]

## 2012-02-13 NOTE — ED Notes (Signed)
PA HAS BEEN IN AND REASSESSED PT . DECISION MADE TO TAKE PT OFF PROTOCOL. ORDERS PLACED.

## 2012-02-13 NOTE — ED Notes (Signed)
SPOKE WITH LACY IN RESP THERAPY ABOUT ABG. SHE WAS NOT AWARE OF TEST. WILL COME DRAW SAMPLE

## 2012-02-13 NOTE — ED Notes (Signed)
Pt resting quietly at the time. Remains on cardiac monitor. No signs of distress noted. Pt to have CTA this morning, will monitor HR x1 hour before sending for testing.

## 2012-02-13 NOTE — ED Notes (Signed)
During assessment noticed pt is on 4 liters of oxygen. sats 96-97 . When on room air pt sats 89-90. Per pt family she is a smoker and does not use oxygen at home.

## 2012-02-13 NOTE — Progress Notes (Signed)
Contacted MD and communicated latest ABG results.  Informed MD that respiratory recommended switching pt to nasal canula 6 L, currently pt saturation 100% on non-rebreather.  MD agreed and nurse removed non re-breather and applied [place].  Nurse will continue to monitor.

## 2012-02-13 NOTE — ED Notes (Signed)
Pt placed on 2 liters oxygen. Pa made aware of sats

## 2012-02-13 NOTE — ED Notes (Signed)
UNABLE TO MOVE PT  TO FLOOR DUE TO NO ORDERS AND NO TEMP ORDERS. NOW ADMITTING DOCTOR IS IN WITH PT.

## 2012-02-13 NOTE — ED Notes (Signed)
resp therapy discussing result of abg and possible plan of care with pt.

## 2012-02-14 ENCOUNTER — Encounter (HOSPITAL_COMMUNITY): Payer: Self-pay

## 2012-02-14 ENCOUNTER — Encounter (HOSPITAL_COMMUNITY)
Admission: EM | Disposition: A | Discharge status: Home / Self Care | Payer: Self-pay | Source: Home / Self Care | Attending: Internal Medicine

## 2012-02-14 DIAGNOSIS — F17200 Nicotine dependence, unspecified, uncomplicated: Secondary | ICD-10-CM

## 2012-02-14 DIAGNOSIS — R079 Chest pain, unspecified: Secondary | ICD-10-CM

## 2012-02-14 DIAGNOSIS — R7309 Other abnormal glucose: Secondary | ICD-10-CM

## 2012-02-14 DIAGNOSIS — R109 Unspecified abdominal pain: Secondary | ICD-10-CM

## 2012-02-14 HISTORY — PX: ESOPHAGOGASTRODUODENOSCOPY: SHX5428

## 2012-02-14 LAB — CK TOTAL AND CKMB (NOT AT ARMC): Relative Index: INVALID (ref 0.0–2.5)

## 2012-02-14 LAB — TSH: TSH: 1.517 u[IU]/mL (ref 0.350–4.500)

## 2012-02-14 LAB — CBC
MCH: 28.4 pg (ref 26.0–34.0)
MCHC: 33.1 g/dL (ref 30.0–36.0)
RBC: 4.23 MIL/uL (ref 3.87–5.11)

## 2012-02-14 LAB — LIPASE, BLOOD: Lipase: 30 U/L (ref 11–59)

## 2012-02-14 LAB — BASIC METABOLIC PANEL
Calcium: 9.4 mg/dL (ref 8.4–10.5)
GFR calc non Af Amer: 87 mL/min — ABNORMAL LOW (ref >90)
Glucose, Bld: 243 mg/dL — ABNORMAL HIGH (ref 70–99)
Sodium: 135 mEq/L (ref 135–145)

## 2012-02-14 LAB — HEMOGLOBIN A1C: Mean Plasma Glucose: 229 mg/dL — ABNORMAL HIGH (ref <117)

## 2012-02-14 LAB — TROPONIN I: Troponin I: <0.3 ng/mL (ref <0.30)

## 2012-02-14 SURGERY — EGD (ESOPHAGOGASTRODUODENOSCOPY)
Anesthesia: Moderate Sedation

## 2012-02-14 MED ORDER — PREDNISONE 20 MG PO TABS
40.0000 mg | ORAL_TABLET | Freq: Every day | ORAL | Status: DC
  Administered 2012-02-15: 40 mg via ORAL
  Filled 2012-02-14 (×3): qty 2

## 2012-02-14 MED ORDER — DIPHENHYDRAMINE HCL 50 MG/ML IJ SOLN
INTRAMUSCULAR | Status: AC
  Filled 2012-02-14: qty 1

## 2012-02-14 MED ORDER — ALBUTEROL SULFATE (5 MG/ML) 0.5% IN NEBU
2.5000 mg | INHALATION_SOLUTION | Freq: Three times a day (TID) | RESPIRATORY_TRACT | Status: DC
  Administered 2012-02-14: 2.5 mg via RESPIRATORY_TRACT
  Filled 2012-02-14: qty 0.5

## 2012-02-14 MED ORDER — FENTANYL CITRATE 0.05 MG/ML IJ SOLN
INTRAMUSCULAR | Status: AC
  Filled 2012-02-14: qty 2

## 2012-02-14 MED ORDER — MIDAZOLAM HCL 10 MG/2ML IJ SOLN
INTRAMUSCULAR | Status: DC | PRN
  Administered 2012-02-14 (×3): 1 mg via INTRAVENOUS

## 2012-02-14 MED ORDER — METHYLPREDNISOLONE SODIUM SUCC 40 MG IJ SOLR: 40.0000 mg | Freq: Two times a day (BID) | INTRAMUSCULAR | Status: DC

## 2012-02-14 MED ORDER — FENTANYL CITRATE 0.05 MG/ML IJ SOLN
INTRAMUSCULAR | Status: DC | PRN
  Administered 2012-02-14 (×2): 12.5 ug via INTRAVENOUS

## 2012-02-14 MED ORDER — IPRATROPIUM BROMIDE 0.02 % IN SOLN
0.5000 mg | Freq: Three times a day (TID) | RESPIRATORY_TRACT | Status: DC
  Administered 2012-02-14: 0.5 mg via RESPIRATORY_TRACT
  Filled 2012-02-14: qty 2.5

## 2012-02-14 MED ORDER — INSULIN ASPART 100 UNIT/ML ~~LOC~~ SOLN
0.0000 [IU] | Freq: Three times a day (TID) | SUBCUTANEOUS | Status: DC
  Administered 2012-02-14: 5 [IU] via SUBCUTANEOUS
  Administered 2012-02-15: 2 [IU] via SUBCUTANEOUS
  Administered 2012-02-15: 5 [IU] via SUBCUTANEOUS
  Administered 2012-02-15: 2 [IU] via SUBCUTANEOUS

## 2012-02-14 MED ORDER — TIOTROPIUM BROMIDE MONOHYDRATE 18 MCG IN CAPS
18.0000 ug | ORAL_CAPSULE | Freq: Every day | RESPIRATORY_TRACT | Status: DC
  Filled 2012-02-14: qty 5

## 2012-02-14 MED ORDER — IPRATROPIUM-ALBUTEROL 18-103 MCG/ACT IN AERO: 2.0000 | INHALATION_SPRAY | RESPIRATORY_TRACT | Status: DC

## 2012-02-14 MED ORDER — MIDAZOLAM HCL 5 MG/ML IJ SOLN
INTRAMUSCULAR | Status: AC
  Filled 2012-02-14: qty 2

## 2012-02-14 MED ORDER — METHYLPREDNISOLONE SODIUM SUCC 40 MG IJ SOLR: 40.0000 mg | Freq: Three times a day (TID) | INTRAMUSCULAR | Status: DC

## 2012-02-14 MED ORDER — IPRATROPIUM-ALBUTEROL 18-103 MCG/ACT IN AERO
2.0000 | INHALATION_SPRAY | RESPIRATORY_TRACT | Status: DC
  Filled 2012-02-14: qty 14.7

## 2012-02-14 MED ORDER — SODIUM CHLORIDE 0.9 % IV SOLN
INTRAVENOUS | Status: DC
  Administered 2012-02-14: 10 mL/h via INTRAVENOUS

## 2012-02-14 MED ORDER — GI COCKTAIL ~~LOC~~
30.0000 mL | Freq: Three times a day (TID) | ORAL | Status: DC | PRN
  Filled 2012-02-14: qty 30

## 2012-02-14 MED ORDER — INSULIN ASPART 100 UNIT/ML ~~LOC~~ SOLN
0.0000 [IU] | Freq: Every day | SUBCUTANEOUS | Status: DC
  Administered 2012-02-14: 4 [IU] via SUBCUTANEOUS

## 2012-02-14 MED ORDER — ALBUTEROL SULFATE HFA 108 (90 BASE) MCG/ACT IN AERS
2.0000 | INHALATION_SPRAY | Freq: Four times a day (QID) | RESPIRATORY_TRACT | Status: DC
  Administered 2012-02-14: 2 via RESPIRATORY_TRACT
  Filled 2012-02-14 (×2): qty 6.7

## 2012-02-14 NOTE — Progress Notes (Addendum)
1619: Pt transferred to endo by Endo RN, pt was alert and oriented prior to transport.  Pt grandson was at bedside.  Pt belongings were transported by grandson.  Post procedure pt will be transported to new room assignment 5530.  Called report to Ginger at approximately 1621 on unit [id number].   1600:  Dr. Jeoffrey Massed explained EGD procedure to pt in native language.  Nurse witnessed information share between MD, pt and pts son.  Consent form was signed by son on behalf of pt.  MD requested that pt remain on current floor until EGD procedure with transfer occuring post procedure.  Nurse will communicate this to new floor.

## 2012-02-14 NOTE — Consult Note (Signed)
Reason for Consult:Epigastric pain Referring Physician: [facility]  Martha Gillespie HPI: This is a 71 year old female admitted for acute respiratory failure secondary to her COPD, but he also complains of having issues with epigastric pain.  The history was provided by Dr. Jerral Ralph, who is fluent in her native language.  He reports that she has had intermittent epigastric pain in the past and it was associated with melena at times.  Her daughter, who speaks limited English, states that her mother started to have epigastric pain 3 days ago.  No reports of nausea, vomiting, fevers, chills, diarrhea, or constipation.  In the past she was treated with ranitidine and it helped her symptoms.  Currently her respiratory failure has resolved and she is on 2 liters of nasal canula saturating at 94%.  It increases up to 96% with deep inspiration.  History reviewed. No pertinent past medical history.  History reviewed. No pertinent past surgical history.  History reviewed. No pertinent family history.  Social History:  reports that she has been smoking.  She does not have any smokeless tobacco history on file. She reports that she does not drink alcohol or use illicit drugs.  Allergies: No Known Allergies  Medications:  Scheduled:   . sodium chloride   Intravenous STAT  . albuterol  5 mg Nebulization Once  . albuterol-ipratropium  2 puff Inhalation Q4H  . antiseptic oral rinse  15 mL Mouth Rinse BID  . enoxaparin (LOVENOX) injection  40 mg Subcutaneous Q24H  . ipratropium  0.5 mg Nebulization Once  . methylPREDNISolone (SOLU-MEDROL) injection  125 mg Intravenous Once  . nicotine  21 mg Transdermal Daily  . pantoprazole  40 mg Oral Q0600  . predniSONE  40 mg Oral Q breakfast  . sodium chloride  3 mL Intravenous Q12H  . tiotropium  18 mcg Inhalation Daily  . DISCONTD: albuterol  2.5 mg Nebulization TID  . DISCONTD: albuterol  5 mg Nebulization Q4H  . DISCONTD: budesonide  0.25 mg  Nebulization BID  . DISCONTD: ipratropium  0.5 mg Nebulization Q6H  . DISCONTD: ipratropium  0.5 mg Nebulization Q4H  . DISCONTD: ipratropium  0.5 mg Nebulization TID  . DISCONTD: levofloxacin (LEVAQUIN) IV  750 mg Intravenous Q24H  . DISCONTD: methylPREDNISolone (SOLU-MEDROL) injection  60 mg Intravenous Q6H  . DISCONTD: methylPREDNISolone (SOLU-MEDROL) injection  40 mg Intravenous Q8H  . DISCONTD: methylPREDNISolone (SOLU-MEDROL) injection  40 mg Intravenous Q12H   Continuous:   Results for orders placed during the hospital encounter of 02/12/12 (from the past 24 hour(s))  POCT I-STAT 3, BLOOD GAS (G3+)     Status: Abnormal   Collection Time   02/13/12  2:23 PM      Component Value Range   pH, Arterial 7.291 (*) 7.350 - 7.450   pCO2 arterial 53.2 (*) 35.0 - 45.0 mmHg   pO2, Arterial 57.0 (*) 80.0 - 100.0 mmHg   Bicarbonate 25.6 (*) 20.0 - 24.0 mEq/L   TCO2 27  0 - 100 mmol/L   O2 Saturation 85.0     Acid-base deficit 2.0  0.0 - 2.0 mmol/L   Collection site RADIAL, ALLEN'S TEST ACCEPTABLE     Drawn by Operator     Sample type ARTERIAL    BLOOD GAS, ARTERIAL     Status: Abnormal   Collection Time   02/13/12  4:51 PM      Component Value Range   FIO2 1.00     Delivery systems OXYGEN MASK  pH, Arterial 7.309 (*) 7.350 - 7.450   pCO2 arterial 50.3 (*) 35.0 - 45.0 mmHg   pO2, Arterial 165.0 (*) 80.0 - 100.0 mmHg   Bicarbonate 24.5 (*) 20.0 - 24.0 mEq/L   TCO2 26.1  0 - 100 mmol/L   Acid-base deficit 0.9  0.0 - 2.0 mmol/L   O2 Saturation 99.4     Patient temperature 98.6     Collection site RIGHT RADIAL     Drawn by [phone]     Sample type ARTERIAL DRAW     Allens test (pass/fail) PASS  PASS  MRSA PCR SCREENING     Status: Normal   Collection Time   02/13/12  6:13 PM      Component Value Range   MRSA by PCR NEGATIVE  NEGATIVE  LACTIC ACID, PLASMA     Status: Normal   Collection Time   02/13/12  7:11 PM      Component Value Range   Lactic Acid, Venous 1.4  0.5 - 2.2 mmol/L   CBC     Status: Normal   Collection Time   02/13/12  7:13 PM      Component Value Range   WBC 10.5  4.0 - 10.5 K/uL   RBC 4.50  3.87 - 5.11 MIL/uL   Hemoglobin 12.8  12.0 - 15.0 g/dL   HCT 60.4  54.0 - 98.1 %   MCV 86.0  78.0 - 100.0 fL   MCH 28.4  26.0 - 34.0 pg   MCHC 33.1  30.0 - 36.0 g/dL   RDW 19.1  47.8 - 29.5 %   Platelets 256  150 - 400 K/uL  CREATININE, SERUM     Status: Abnormal   Collection Time   02/13/12  7:13 PM      Component Value Range   Creatinine, Ser 0.71  0.50 - 1.10 mg/dL   GFR calc non Af Amer 86 (*) >90 mL/min   GFR calc Af Amer >90  >90 mL/min  TSH     Status: Normal   Collection Time   02/13/12  7:13 PM      Component Value Range   TSH 1.517  0.350 - 4.500 uIU/mL  HEMOGLOBIN A1C     Status: Abnormal   Collection Time   02/13/12  7:13 PM      Component Value Range   Hemoglobin A1C 9.6 (*) <5.7 %   Mean Plasma Glucose 229 (*) <117 mg/dL  TROPONIN I     Status: Normal   Collection Time   02/13/12  7:13 PM      Component Value Range   Troponin I <0.30  <0.30 ng/mL  CK TOTAL AND CKMB     Status: Normal   Collection Time   02/13/12  7:13 PM      Component Value Range   Total CK 97  7 - 177 U/L   CK, MB 2.0  0.3 - 4.0 ng/mL   Relative Index RELATIVE INDEX IS INVALID  0.0 - 2.5  TROPONIN I     Status: Normal   Collection Time   02/13/12 11:37 PM      Component Value Range   Troponin I <0.30  <0.30 ng/mL  CK TOTAL AND CKMB     Status: Normal   Collection Time   02/13/12 11:37 PM      Component Value Range   Total CK 94  7 - 177 U/L   CK, MB 1.9  0.3 - 4.0 ng/mL  Relative Index RELATIVE INDEX IS INVALID  0.0 - 2.5  BASIC METABOLIC PANEL     Status: Abnormal   Collection Time   02/14/12  6:10 AM      Component Value Range   Sodium 135  135 - 145 mEq/L   Potassium 4.5  3.5 - 5.1 mEq/L   Chloride 101  96 - 112 mEq/L   CO2 25  19 - 32 mEq/L   Glucose, Bld 243 (*) 70 - 99 mg/dL   BUN 18  6 - 23 mg/dL   Creatinine, Ser 3.08  0.50 - 1.10 mg/dL    Calcium 9.4  8.4 - 65.7 mg/dL   GFR calc non Af Amer 87 (*) >90 mL/min   GFR calc Af Amer >90  >90 mL/min  CBC     Status: Abnormal   Collection Time   02/14/12  6:10 AM      Component Value Range   WBC 13.3 (*) 4.0 - 10.5 K/uL   RBC 4.23  3.87 - 5.11 MIL/uL   Hemoglobin 12.0  12.0 - 15.0 g/dL   HCT 84.6  96.2 - 95.2 %   MCV 85.8  78.0 - 100.0 fL   MCH 28.4  26.0 - 34.0 pg   MCHC 33.1  30.0 - 36.0 g/dL   RDW 84.1  32.4 - 40.1 %   Platelets 235  150 - 400 K/uL  TROPONIN I     Status: Normal   Collection Time   02/14/12  6:10 AM      Component Value Range   Troponin I <0.30  <0.30 ng/mL  CK TOTAL AND CKMB     Status: Normal   Collection Time   02/14/12  6:10 AM      Component Value Range   Total CK 89  7 - 177 U/L   CK, MB 2.0  0.3 - 4.0 ng/mL   Relative Index RELATIVE INDEX IS INVALID  0.0 - 2.5  LIPASE, BLOOD     Status: Normal   Collection Time   02/14/12  6:10 AM      Component Value Range   Lipase 30  11 - 59 U/L     Dg Chest 2 View  02/12/2012  *RADIOLOGY REPORT*  Clinical Data: Chest pain  CHEST - 2 VIEW  Comparison: None.  Findings: The lungs are clear without focal infiltrate, edema, pneumothorax or pleural effusion. Interstitial markings are diffusely coarsened with chronic features. The cardiopericardial silhouette is enlarged.  Imaged bony structures of the thorax are intact.  IMPRESSION: Cardiomegaly with chronic interstitial coarsening.  No acute cardiopulmonary process.   Original Report Authenticated By: ERIC A. MANSELL, M.D.    Ct Angio Chest W/cm &/or Wo Cm  02/13/2012  *RADIOLOGY REPORT*  Clinical Data: 71 year old female with chest pain and shortness of breath.  CT ANGIOGRAPHY CHEST  Technique:  Multidetector CT imaging of the chest using the standard protocol during bolus administration of intravenous contrast. Multiplanar reconstructed images including MIPs were obtained and reviewed to evaluate the vascular anatomy.  Contrast: OMNIPAQUE IOHEXOL 350 MG/ML  SOLN  Comparison: 02/12/2012 chest radiograph  Findings: There is a technically satisfactory study.  No pulmonary emboli are identified.  There is no evidence of thoracic aortic aneurysm.  No pleural or pericardial effusions are identified. Mild coronary calcifications are present. Heart size is upper limits of normal. There are no enlarged lymph nodes identified.  There is no evidence of airspace disease, consolidation, suspicious pulmonary nodule/mass or endobronchial/endotracheal lesion.  Minimal right basilar scarring/atelectasis identified.  No acute or suspicious bony abnormalities are identified. Diffuse fatty infiltration of the liver is identified.  IMPRESSION: No evidence of acute abnormality - no evidence of pulmonary emboli.  Upper limits of normal heart size.  Fatty infiltration of the liver.   Original Report Authenticated By: [name], M.D.    [name] 2 Views  02/13/2012  *RADIOLOGY REPORT*  Clinical Data: Left lower quadrant abdominal pain.  ABDOMEN - 2 VIEW  Comparison: None.  Findings: Normal bowel gas pattern.  No free peritoneal air. Excreted contrast in the renal collecting systems and urinary bladder.  Lumbar spine degenerative changes and mild scoliosis.  IMPRESSION: No acute abnormality.   Original Report Authenticated By: [name], M.D.     ROS:  As stated above in the HPI otherwise negative.  Blood pressure 117/65, pulse 87, temperature 98.3 F (36.8 C), temperature source Oral, resp. rate 17, height 5\' 3"  (1.6 m), weight 68.2 kg (150 lb 5.7 oz), SpO2 94.00%.    PE: Gen: NAD, Alert and Oriented HEENT:  [place]/AT, EOMI Neck: Supple, no LAD Lungs: CTA Bilaterally CV: RRR without M/G/R ABM: Soft, mild epigastric tenderness, +BS Ext: No C/C/E  Assessment/Plan: 1) Epigastric tenderness   She may have a history of GERD.  I will perform an EGD for further evaluation.  Another consideration is gallstones, but I do not have any imaging to determine if she has stones.  I  will make further recommendations pending the findings.  Plan: 1) EGD today.  Taylin Mans D 02/14/2012, 2:17 PM

## 2012-02-14 NOTE — Progress Notes (Signed)
  Echocardiogram 2D Echocardiogram has been performed.  Mardel Grudzien FRANCES 02/14/2012, 12:07 PM 

## 2012-02-14 NOTE — H&P (View-Only) (Signed)
PATIENT DETAILS Name: Martha Gillespie Age: 71 y.o. Sex: female Date of Birth: 12/12/1940 Admit Date: 02/12/2012 Admitting Physician Ripudeep K Rai, MD PCP:Pcp Not In System  Subjective: Admitted with persistent epigastric pain-per patient she has a long history of reflux/PUD like symptoms-never has had a EGD in past.Pain exclusively in the epigastric area-better than on admission. No SOB currently  Assessment/Plan: Principal Problem: Epigastric Pain -this is acute on chronic-long history of GERD/PUD like symptoms-was treated with H2 blockers in [place] with relief -claims to have intermittent black stools in the past-however not overtly anemic-no recent Melena -this looks like to be all GI from-PUD/GERD -cardiac enzymes neg, CTA Chest neg for PE, 2 D Echo-pending -c/w PPI -prn GI coctail -spoke with GI MD-Dr Mann-asked to keep NPO for potential EGD later today  Active Problems: COPD with exacerbation -long h/o smoking -CO2 elevated on ABG-but awake/alert -lungs clear-with good air movement -on admission-perhaps she did have a flare of COPD-given ABG results -transition to prednisone-given potential for PUD-would taper it off quickly -will stop Levaquin-no PNA seen on CT Chest -will need Inhalers on discharge-however will need extensive inhaler education by the resp therapist/RN-therefore will transition to inhalers while inpatient  Hyperglycemia -Glucose elevated in chemistries -check A1C   Tobacco abuse -counseled extensively -transdermal nicotine  Disposition: Remain inpatient  DVT Prophylaxis: Prophylactic Lovenox   Code Status: Full code   Procedures: None  CONSULTS:  GI  PHYSICAL EXAM: Vital signs in last 24 hours: Filed Vitals:   02/14/12 0625 02/14/12 0820 02/14/12 0842 02/14/12 1129  BP: 125/68  118/65 117/65  Pulse: 84  95 87  Temp:   98 F (36.7 C) 98.3 F (36.8 C)  TempSrc:   Oral Oral  Resp: 18  21 17  Height:      Weight:      SpO2: 95%  94% 93% 94%    Weight change: 0.8 kg (1 lb 12.2 oz) Body mass index is 26.63 kg/(m^2).   Gen Exam: Awake and alert with clear speech.   Neck: Supple, No JVD.   Chest: B/L Clear.   CVS: S1 S2 Regular, no murmurs.  Abdomen: soft, BS +, tender epigastric area, non distended.  Extremities: no edema, lower extremities warm to touch. Neurologic: Non Focal.   Skin: No Rash.   Wounds: N/A.    Intake/Output from previous day:  Intake/Output Summary (Last 24 hours) at 02/14/12 1335 Last data filed at 02/14/12 1112  Gross per 24 hour  Intake   1653 ml  Output    250 ml  Net   1403 ml     LAB RESULTS: CBC  Lab 02/14/12 0610 02/13/12 1913 02/12/12 2030  WBC 13.3* 10.5 12.4*  HGB 12.0 12.8 13.2  HCT 36.3 38.7 39.1  PLT 235 256 271  MCV 85.8 86.0 84.8  MCH 28.4 28.4 28.6  MCHC 33.1 33.1 33.8  RDW 13.5 14.0 13.7  LYMPHSABS -- -- --  MONOABS -- -- --  EOSABS -- -- --  BASOSABS -- -- --  BANDABS -- -- --    Chemistries   Lab 02/14/12 0610 02/13/12 1913 02/12/12 2030  NA 135 -- 133*  K 4.5 -- 4.1  CL 101 -- 97  CO2 25 -- 25  GLUCOSE 243* -- 252*  BUN 18 -- 16  CREATININE 0.69 0.71 0.61  CALCIUM 9.4 -- 9.6  MG -- -- --    CBG:  Lab 02/13/12 1253  GLUCAP 107*    GFR Estimated Creatinine Clearance: 61.5   ml/min (by C-G formula based on Cr of 0.69).  Coagulation profile No results found for this basename: INR:5,PROTIME:5 in the last 168 hours  Cardiac Enzymes  Lab 02/14/12 0610 02/13/12 2337 02/13/12 1913  CKMB 2.0 1.9 2.0  TROPONINI <0.30 <0.30 <0.30  MYOGLOBIN -- -- --    No components found with this basename: POCBNP:3 No results found for this basename: DDIMER:2 in the last 72 hours  Basename 02/13/12 1913  HGBA1C 9.6*   No results found for this basename: CHOL:2,HDL:2,LDLCALC:2,TRIG:2,CHOLHDL:2,LDLDIRECT:2 in the last 72 hours  Basename 02/13/12 1913  TSH 1.517  T4TOTAL --  T3FREE --  THYROIDAB --   No results found for this basename:  VITAMINB12:2,FOLATE:2,FERRITIN:2,TIBC:2,IRON:2,RETICCTPCT:2 in the last 72 hours  Basename 02/14/12 0610 02/12/12 2030  LIPASE 30 66*  AMYLASE -- --    Urine Studies No results found for this basename: UACOL:2,UAPR:2,USPG:2,UPH:2,UTP:2,UGL:2,UKET:2,UBIL:2,UHGB:2,UNIT:2,UROB:2,ULEU:2,UEPI:2,UWBC:2,URBC:2,UBAC:2,CAST:2,CRYS:2,UCOM:2,BILUA:2 in the last 72 hours  MICROBIOLOGY: Recent Results (from the past 240 hour(s))  MRSA PCR SCREENING     Status: Normal   Collection Time   02/13/12  6:13 PM      Component Value Range Status Comment   MRSA by PCR NEGATIVE  NEGATIVE Final     RADIOLOGY STUDIES/RESULTS: Dg Chest 2 View  02/12/2012  *RADIOLOGY REPORT*  Clinical Data: Chest pain  CHEST - 2 VIEW  Comparison: None.  Findings: The lungs are clear without focal infiltrate, edema, pneumothorax or pleural effusion. Interstitial markings are diffusely coarsened with chronic features. The cardiopericardial silhouette is enlarged.  Imaged bony structures of the thorax are intact.  IMPRESSION: Cardiomegaly with chronic interstitial coarsening.  No acute cardiopulmonary process.   Original Report Authenticated By: ERIC A. MANSELL, M.D.    Ct Angio Chest W/cm &/or Wo Cm  02/13/2012  *RADIOLOGY REPORT*  Clinical Data: 71-year-old female with chest pain and shortness of breath.  CT ANGIOGRAPHY CHEST  Technique:  Multidetector CT imaging of the chest using the standard protocol during bolus administration of intravenous contrast. Multiplanar reconstructed images including MIPs were obtained and reviewed to evaluate the vascular anatomy.  Contrast: 100mL OMNIPAQUE IOHEXOL 350 MG/ML SOLN  Comparison: 02/12/2012 chest radiograph  Findings: There is a technically satisfactory study.  No pulmonary emboli are identified.  There is no evidence of thoracic aortic aneurysm.  No pleural or pericardial effusions are identified. Mild coronary calcifications are present. Heart size is upper limits of normal. There are no  enlarged lymph nodes identified.  There is no evidence of airspace disease, consolidation, suspicious pulmonary nodule/mass or endobronchial/endotracheal lesion. Minimal right basilar scarring/atelectasis identified.  No acute or suspicious bony abnormalities are identified. Diffuse fatty infiltration of the liver is identified.  IMPRESSION: No evidence of acute abnormality - no evidence of pulmonary emboli.  Upper limits of normal heart size.  Fatty infiltration of the liver.   Original Report Authenticated By: JEFFREY T. HU, M.D.    [name] 2 Views  02/13/2012  *RADIOLOGY REPORT*  Clinical Data: Left lower quadrant abdominal pain.  ABDOMEN - 2 VIEW  Comparison: None.  Findings: Normal bowel gas pattern.  No free peritoneal air. Excreted contrast in the renal collecting systems and urinary bladder.  Lumbar spine degenerative changes and mild scoliosis.  IMPRESSION: No acute abnormality.   Original Report Authenticated By: STEVEN H. REID, M.D.     MEDICATIONS: Scheduled Meds:   . sodium chloride   Intravenous STAT  . albuterol  2.5 mg Nebulization TID  . albuterol  5 mg Nebulization Once  . antiseptic oral rinse    15 mL Mouth Rinse BID  . budesonide  0.25 mg Nebulization BID  . enoxaparin (LOVENOX) injection  40 mg Subcutaneous Q24H  . ipratropium  0.5 mg Nebulization Once  . ipratropium  0.5 mg Nebulization TID  . methylPREDNISolone (SOLU-MEDROL) injection  125 mg Intravenous Once  . nicotine  21 mg Transdermal Daily  . pantoprazole  40 mg Oral Q0600  . predniSONE  40 mg Oral Q breakfast  . sodium chloride  3 mL Intravenous Q12H  . DISCONTD: albuterol  5 mg Nebulization Q4H  . DISCONTD: ipratropium  0.5 mg Nebulization Q6H  . DISCONTD: ipratropium  0.5 mg Nebulization Q4H  . DISCONTD: levofloxacin (LEVAQUIN) IV  750 mg Intravenous Q24H  . DISCONTD: methylPREDNISolone (SOLU-MEDROL) injection  60 mg Intravenous Q6H  . DISCONTD: methylPREDNISolone (SOLU-MEDROL) injection  40 mg Intravenous Q8H   . DISCONTD: methylPREDNISolone (SOLU-MEDROL) injection  40 mg Intravenous Q12H   Continuous Infusions:  PRN Meds:.sodium chloride, acetaminophen, acetaminophen, albuterol, alum & mag hydroxide-simeth, gi cocktail, guaiFENesin-dextromethorphan, HYDROcodone-acetaminophen, HYDROmorphone (DILAUDID) injection, iohexol, ondansetron (ZOFRAN) IV, ondansetron, sodium chloride  Antibiotics: Anti-infectives     Start     Dose/Rate Route Frequency Ordered Stop   02/13/12 1900   levofloxacin (LEVAQUIN) IVPB 750 mg  Status:  Discontinued        750 mg 100 mL/hr over 90 Minutes Intravenous Every 24 hours 02/13/12 1700 02/14/12 1333           Elynn Patteson, MD  [facility] Pager:[phone]  If 7PM-7AM, please contact night-coverage [email or web address] Password [email or web address] 02/14/2012, 1:35 PM   LOS: 2 days     

## 2012-02-14 NOTE — Op Note (Signed)
[facility] [address] [place], [address]   OPERATIVE PROCEDURE REPORT  PATIENT: Martha Gillespie, Martha Gillespie  MR#: [id number] BIRTHDATE: 05/16/1941  GENDER: Female ENDOSCOPIST: Jeani Hawking, MD ASSISTANT:   Angelique Blonder, RN and Windell Hummingbird, Technician PROCEDURE DATE: 02/14/2012 PROCEDURE:   EGD, diagnostic ASA CLASS:   Class III INDICATIONS:Epigastric abdominal pain. MEDICATIONS: Fentanyl 25 mcg IV and Versed 3 mg IV TOPICAL ANESTHETIC:   none  DESCRIPTION OF PROCEDURE:   After the risks benefits and alternatives of the procedure were thoroughly explained, informed consent was obtained.  The Pentax Gastroscope [id number]  endoscope was introduced through the mouth  and advanced to the second portion of the duodenum Without limitations.      The instrument was slowly withdrawn as the mucosa was fully examined.      STOMACH:  Two small clean-based antral ullcers were found.  No evidence of any surrounding gastritis.  The esophagus and the duodenum were normal.   Retroflexed views revealed no abnormalities.     The scope was then withdrawn from the patient and the procedure terminated.  COMPLICATIONS: There were no complications. IMPRESSION:Ulcer(s) were found  RECOMMENDATIONS:1) Continue with Protonix. 2) Check liver panel and a RUQ U/S to make sure that gallstones are not present.  The two small ulcers to not necessarily explain her pain.   _______________________________ eSigned:  Jeani Hawking, MD 02/14/2012 5:06 PM

## 2012-02-14 NOTE — Progress Notes (Signed)
PATIENT DETAILS Name: Martha Gillespie Age: 71 y.o. Sex: female Date of Birth: [birth year] Admit Date: 02/12/2012 Admitting Physician Ripudeep Jenna Luo, MD PCP:Pcp Not In System  Subjective: Admitted with persistent epigastric pain-per patient she has a long history of reflux/PUD like symptoms-never has had a EGD in past.Pain exclusively in the epigastric area-better than on admission. No SOB currently  Assessment/Plan: Principal Problem: Epigastric Pain -this is acute on chronic-long history of GERD/PUD like symptoms-was treated with H2 blockers in [place] with relief -claims to have intermittent black stools in the past-however not overtly anemic-no recent Melena -this looks like to be all GI from-PUD/GERD -cardiac enzymes neg, CTA Chest neg for PE, 2 D Echo-pending -c/w PPI -prn GI coctail -spoke with GI MD-Dr Mann-asked to keep NPO for potential EGD later today  Active Problems: COPD with exacerbation -long h/o smoking -CO2 elevated on ABG-but awake/alert -lungs clear-with good air movement -on admission-perhaps she did have a flare of COPD-given ABG results -transition to prednisone-given potential for PUD-would taper it off quickly -will stop Levaquin-no PNA seen on CT Chest -will need Inhalers on discharge-however will need extensive inhaler education by the resp therapist/RN-therefore will transition to inhalers while inpatient  Hyperglycemia -Glucose elevated in chemistries -check A1C   Tobacco abuse -counseled extensively -transdermal nicotine  Disposition: Remain inpatient  DVT Prophylaxis: Prophylactic Lovenox   Code Status: Full code   Procedures: None  CONSULTS:  GI  PHYSICAL EXAM: Vital signs in last 24 hours: Filed Vitals:   02/14/12 0625 02/14/12 0820 02/14/12 0842 02/14/12 1129  BP: 125/68  118/65 117/65  Pulse: 84  95 87  Temp:   98 F (36.7 C) 98.3 F (36.8 C)  TempSrc:   Oral Oral  Resp: 18  21 17   Height:      Weight:      SpO2: 95%  94% 93% 94%    Weight change: 0.8 kg (1 lb 12.2 oz) Body mass index is 26.63 kg/(m^2).   Gen Exam: Awake and alert with clear speech.   Neck: Supple, No JVD.   Chest: B/L Clear.   CVS: S1 S2 Regular, no murmurs.  Abdomen: soft, BS +, tender epigastric area, non distended.  Extremities: no edema, lower extremities warm to touch. Neurologic: Non Focal.   Skin: No Rash.   Wounds: N/A.    Intake/Output from previous day:  Intake/Output Summary (Last 24 hours) at 02/14/12 1335 Last data filed at 02/14/12 1112  Gross per 24 hour  Intake   1653 ml  Output    250 ml  Net   1403 ml     LAB RESULTS: CBC  Lab 02/14/12 0610 02/13/12 1913 02/12/12 2030  WBC 13.3* 10.5 12.4*  HGB 12.0 12.8 13.2  HCT 36.3 38.7 39.1  PLT 235 256 271  MCV 85.8 86.0 84.8  MCH 28.4 28.4 28.6  MCHC 33.1 33.1 33.8  RDW 13.5 14.0 13.7  LYMPHSABS -- -- --  MONOABS -- -- --  EOSABS -- -- --  BASOSABS -- -- --  BANDABS -- -- --    Chemistries   Lab 02/14/12 0610 02/13/12 1913 02/12/12 2030  NA 135 -- 133*  K 4.5 -- 4.1  CL 101 -- 97  CO2 25 -- 25  GLUCOSE 243* -- 252*  BUN 18 -- 16  CREATININE 0.69 0.71 0.61  CALCIUM 9.4 -- 9.6  MG -- -- --    CBG:  Lab 02/13/12 1253  GLUCAP 107*    GFR Estimated Creatinine Clearance: 61.5  ml/min (by C-G formula based on Cr of 0.69).  Coagulation profile No results found for this basename: INR:5,PROTIME:5 in the last 168 hours  Cardiac Enzymes  Lab 02/14/12 0610 02/13/12 2337 02/13/12 1913  CKMB 2.0 1.9 2.0  TROPONINI <0.30 <0.30 <0.30  MYOGLOBIN -- -- --    No components found with this basename: POCBNP:3 No results found for this basename: DDIMER:2 in the last 72 hours  Basename 02/13/12 1913  HGBA1C 9.6*   No results found for this basename: CHOL:2,HDL:2,LDLCALC:2,TRIG:2,CHOLHDL:2,LDLDIRECT:2 in the last 72 hours  Basename 02/13/12 1913  TSH 1.517  T4TOTAL --  T3FREE --  THYROIDAB --   No results found for this basename:  VITAMINB12:2,FOLATE:2,FERRITIN:2,TIBC:2,IRON:2,RETICCTPCT:2 in the last 72 hours  Basename 02/14/12 0610 02/12/12 2030  LIPASE 30 66*  AMYLASE -- --    Urine Studies No results found for this basename: UACOL:2,UAPR:2,USPG:2,UPH:2,UTP:2,UGL:2,UKET:2,UBIL:2,UHGB:2,UNIT:2,UROB:2,ULEU:2,UEPI:2,UWBC:2,URBC:2,UBAC:2,CAST:2,CRYS:2,UCOM:2,BILUA:2 in the last 72 hours  MICROBIOLOGY: Recent Results (from the past 240 hour(s))  MRSA PCR SCREENING     Status: Normal   Collection Time   02/13/12  6:13 PM      Component Value Range Status Comment   MRSA by PCR NEGATIVE  NEGATIVE Final     RADIOLOGY STUDIES/RESULTS: Dg Chest 2 View  02/12/2012  *RADIOLOGY REPORT*  Clinical Data: Chest pain  CHEST - 2 VIEW  Comparison: None.  Findings: The lungs are clear without focal infiltrate, edema, pneumothorax or pleural effusion. Interstitial markings are diffusely coarsened with chronic features. The cardiopericardial silhouette is enlarged.  Imaged bony structures of the thorax are intact.  IMPRESSION: Cardiomegaly with chronic interstitial coarsening.  No acute cardiopulmonary process.   Original Report Authenticated By: ERIC A. MANSELL, M.D.    Ct Angio Chest W/cm &/or Wo Cm  02/13/2012  *RADIOLOGY REPORT*  Clinical Data: 71 year old female with chest pain and shortness of breath.  CT ANGIOGRAPHY CHEST  Technique:  Multidetector CT imaging of the chest using the standard protocol during bolus administration of intravenous contrast. Multiplanar reconstructed images including MIPs were obtained and reviewed to evaluate the vascular anatomy.  Contrast: OMNIPAQUE IOHEXOL 350 MG/ML SOLN  Comparison: 02/12/2012 chest radiograph  Findings: There is a technically satisfactory study.  No pulmonary emboli are identified.  There is no evidence of thoracic aortic aneurysm.  No pleural or pericardial effusions are identified. Mild coronary calcifications are present. Heart size is upper limits of normal. There are no  enlarged lymph nodes identified.  There is no evidence of airspace disease, consolidation, suspicious pulmonary nodule/mass or endobronchial/endotracheal lesion. Minimal right basilar scarring/atelectasis identified.  No acute or suspicious bony abnormalities are identified. Diffuse fatty infiltration of the liver is identified.  IMPRESSION: No evidence of acute abnormality - no evidence of pulmonary emboli.  Upper limits of normal heart size.  Fatty infiltration of the liver.   Original Report Authenticated By: [name], M.D.    [name] 2 Views  02/13/2012  *RADIOLOGY REPORT*  Clinical Data: Left lower quadrant abdominal pain.  ABDOMEN - 2 VIEW  Comparison: None.  Findings: Normal bowel gas pattern.  No free peritoneal air. Excreted contrast in the renal collecting systems and urinary bladder.  Lumbar spine degenerative changes and mild scoliosis.  IMPRESSION: No acute abnormality.   Original Report Authenticated By: [name], M.D.     MEDICATIONS: Scheduled Meds:   . sodium chloride   Intravenous STAT  . albuterol  2.5 mg Nebulization TID  . albuterol  5 mg Nebulization Once  . antiseptic oral rinse  15 mL Mouth Rinse BID  . budesonide  0.25 mg Nebulization BID  . enoxaparin (LOVENOX) injection  40 mg Subcutaneous Q24H  . ipratropium  0.5 mg Nebulization Once  . ipratropium  0.5 mg Nebulization TID  . methylPREDNISolone (SOLU-MEDROL) injection  125 mg Intravenous Once  . nicotine  21 mg Transdermal Daily  . pantoprazole  40 mg Oral Q0600  . predniSONE  40 mg Oral Q breakfast  . sodium chloride  3 mL Intravenous Q12H  . DISCONTD: albuterol  5 mg Nebulization Q4H  . DISCONTD: ipratropium  0.5 mg Nebulization Q6H  . DISCONTD: ipratropium  0.5 mg Nebulization Q4H  . DISCONTD: levofloxacin (LEVAQUIN) IV  750 mg Intravenous Q24H  . DISCONTD: methylPREDNISolone (SOLU-MEDROL) injection  60 mg Intravenous Q6H  . DISCONTD: methylPREDNISolone (SOLU-MEDROL) injection  40 mg Intravenous Q8H   . DISCONTD: methylPREDNISolone (SOLU-MEDROL) injection  40 mg Intravenous Q12H   Continuous Infusions:  PRN Meds:.sodium chloride, acetaminophen, acetaminophen, albuterol, alum & mag hydroxide-simeth, gi cocktail, guaiFENesin-dextromethorphan, HYDROcodone-acetaminophen, HYDROmorphone (DILAUDID) injection, iohexol, ondansetron (ZOFRAN) IV, ondansetron, sodium chloride  Antibiotics: Anti-infectives     Start     Dose/Rate Route Frequency Ordered Stop   02/13/12 1900   levofloxacin (LEVAQUIN) IVPB 750 mg  Status:  Discontinued        750 mg 100 mL/hr over 90 Minutes Intravenous Every 24 hours 02/13/12 1700 02/14/12 1333           GHIMIRE,SHANKER, MD  [facility] Pager:[phone]  If 7PM-7AM, please contact night-coverage [email or web address] Password [email or web address] 02/14/2012, 1:35 PM   LOS: 2 days

## 2012-02-14 NOTE — Interval H&P Note (Signed)
History and Physical Interval Note:  02/14/2012 4:49 PM  Martha Gillespie  has presented today for surgery, with the diagnosis of Epigastric pain  The various methods of treatment have been discussed with the patient and family. After consideration of risks, benefits and other options for treatment, the patient has consented to  Procedure(s) (LRB) with comments: ESOPHAGOGASTRODUODENOSCOPY (EGD) (N/A) as a surgical intervention .  The patient's history has been reviewed, patient examined, no change in status, stable for surgery.  I have reviewed the patient's chart and labs.  Questions were answered to the patient's satisfaction.     Caster Fayette D

## 2012-02-14 NOTE — Progress Notes (Signed)
Inpatient Diabetes Program Recommendations  AACE/ADA: New Consensus Statement on Inpatient Glycemic Control (2013)  Target Ranges:  Prepandial:   less than 140 mg/dL      Peak postprandial:   less than 180 mg/dL (1-2 hours)      Critically ill patients:  140 - 180 mg/dL   Reason for Visit: Results for Martha Gillespie, Martha Gillespie (MRN [id number]) as of 02/14/2012 11:42  Ref. Range 02/13/2012 19:13  Hemoglobin A1C Latest Range: <5.7 % 9.6 (H)   No previous history of diabetes notes however A1C is elevated.  Please consider CBG's tid with meals and HS with sensitive correction.  Will need education and close follow-up.  Will follow.

## 2012-02-14 NOTE — Plan of Care (Signed)
Problem: ICU Phase Progression Outcomes Goal: O2 sats trending toward baseline Outcome: Progressing Pt saturations ranging from 91-93 on 2L [place]

## 2012-02-15 ENCOUNTER — Inpatient Hospital Stay (HOSPITAL_COMMUNITY): Payer: Medicaid Other

## 2012-02-15 ENCOUNTER — Encounter (HOSPITAL_COMMUNITY): Payer: Self-pay | Admitting: Gastroenterology

## 2012-02-15 DIAGNOSIS — K259 Gastric ulcer, unspecified as acute or chronic, without hemorrhage or perforation: Secondary | ICD-10-CM | POA: Diagnosis present

## 2012-02-15 DIAGNOSIS — J9600 Acute respiratory failure, unspecified whether with hypoxia or hypercapnia: Principal | ICD-10-CM

## 2012-02-15 DIAGNOSIS — J449 Chronic obstructive pulmonary disease, unspecified: Secondary | ICD-10-CM | POA: Diagnosis present

## 2012-02-15 DIAGNOSIS — E119 Type 2 diabetes mellitus without complications: Secondary | ICD-10-CM | POA: Diagnosis present

## 2012-02-15 HISTORY — DX: Type 2 diabetes mellitus without complications: E11.9

## 2012-02-15 LAB — GLUCOSE, CAPILLARY
Glucose-Capillary: 157 mg/dL — ABNORMAL HIGH (ref 70–99)
Glucose-Capillary: 291 mg/dL — ABNORMAL HIGH (ref 70–99)

## 2012-02-15 LAB — URINE CULTURE

## 2012-02-15 LAB — HEPATIC FUNCTION PANEL
ALT: 22 U/L (ref 0–35)
AST: 20 U/L (ref 0–37)
Albumin: 3.2 g/dL — ABNORMAL LOW (ref 3.5–5.2)
Alkaline Phosphatase: 86 U/L (ref 39–117)
Alkaline Phosphatase: 82 U/L (ref 39–117)
Bilirubin, Direct: <0.1 mg/dL (ref 0.0–0.3)
Total Bilirubin: 0.2 mg/dL — ABNORMAL LOW (ref 0.3–1.2)
Total Protein: 6.8 g/dL (ref 6.0–8.3)

## 2012-02-15 LAB — CBC
HCT: 35.5 % — ABNORMAL LOW (ref 36.0–46.0)
MCH: 28.4 pg (ref 26.0–34.0)
MCHC: 33.2 g/dL (ref 30.0–36.0)
RDW: 13.7 % (ref 11.5–15.5)

## 2012-02-15 MED ORDER — PREDNISONE 20 MG PO TABS
30.0000 mg | ORAL_TABLET | Freq: Every day | ORAL | Status: DC
  Filled 2012-02-15: qty 1

## 2012-02-15 MED ORDER — ALBUTEROL SULFATE HFA 108 (90 BASE) MCG/ACT IN AERS: 2.0000 | INHALATION_SPRAY | Freq: Four times a day (QID) | RESPIRATORY_TRACT | Status: DC

## 2012-02-15 MED ORDER — PANTOPRAZOLE SODIUM 40 MG PO TBEC: 40.0000 mg | DELAYED_RELEASE_TABLET | Freq: Every day | ORAL | Status: DC

## 2012-02-15 MED ORDER — METFORMIN HCL 1000 MG PO TABS: 1000.0000 mg | ORAL_TABLET | Freq: Two times a day (BID) | ORAL | Status: DC

## 2012-02-15 MED ORDER — TIOTROPIUM BROMIDE MONOHYDRATE 18 MCG IN CAPS: 18.0000 ug | ORAL_CAPSULE | Freq: Every day | RESPIRATORY_TRACT | Status: DC

## 2012-02-15 MED ORDER — PREDNISONE 10 MG PO TABS: 30.0000 mg | ORAL_TABLET | Freq: Every day | ORAL | Status: DC

## 2012-02-15 MED ORDER — ALBUTEROL SULFATE (5 MG/ML) 0.5% IN NEBU: 2.5000 mg | INHALATION_SOLUTION | RESPIRATORY_TRACT | Status: DC | PRN

## 2012-02-15 NOTE — Progress Notes (Signed)
Room air patient's O2 sat 93%. Ambulated in hall on room air patient's O2 sat ranged from 92-94%. Returned to room O2 sat 93%.  Martha Gillespie [name] 02/15/2012  11:12 AM

## 2012-02-15 NOTE — Discharge Summary (Signed)
Addendum  Patient seen and examined, chart and data base reviewed.  I agree with the discharge notes.  For full details please see Mrs. Algis Downs PA. Note.  Follow up on the H. Pylori studies.  Clint Lipps Pager: [phone] 02/15/2012, 2:49 PM

## 2012-02-15 NOTE — Progress Notes (Signed)
[facility]  Patient Status: New  [facility] is providing the following services: SN  If patient discharges after hours, please call [phone].   Letha Cape 02/15/2012, 3:12 PM

## 2012-02-15 NOTE — Care Management Note (Signed)
    Page 1 of 2   02/15/2012     2:10:44 PM   CARE MANAGEMENT NOTE 02/15/2012  Patient:  Martha Gillespie, Martha Gillespie   Account Number:  [id number]  Date Initiated:  02/14/2012  Documentation initiated by:  Alvira Philips Assessment:   71 yr-old female adm with dx of resp failure; lives with family     Action/Plan:   HHRN   Anticipated DC Date:  02/15/2012   Anticipated DC Plan:  HOME W HOME HEALTH SERVICES      DC Planning Services  CM consult      [facility] Choice  HOME HEALTH   Choice offered to / List presented to:  C-4 Adult Children        HH arranged  HH-1 RN      [facility] agency  [facility]   Status of service:  Completed, signed off Medicare Important Message given?   (If response is "NO", the following Medicare IM given date fields will be blank) Date Medicare IM given:   Date Additional Medicare IM given:    Discharge Disposition:  HOME W HOME HEALTH SERVICES  Per UR Regulation:  Reviewed for med. necessity/level of care/duration of stay  If discussed at Long Length of Stay Meetings, dates discussed:    Comments:  PCP [facility]  02/15/12 14:08 Letha Cape RN, BSN [phone] patient lives with daughter n law who speaks some English. Her daughter n law states patient will need a HHRN and she chose [facility] for [facility].  Referral made to [facility], Marie notified. Soc will begin 24-48 hrs post discharge.  Patient for dc today.

## 2012-02-15 NOTE — Discharge Summary (Signed)
Physician Discharge Summary  Martha Gillespie XBJ:[id number] DOB: 06-19-1940 DOA: 02/12/2012  PCP: Martha Garibaldi, NP  Admit date: 02/12/2012 Discharge date: 02/15/2012  Recommendations for Outpatient Follow-up:  Martha Gillespie has a pending H Pylori Serum Antibody that was drawn at [facility] on 02/15/2012.  Please obtain the results to see if she needs treatment for H Pylori.   On going diabetic and respiratory (COPD) management will be required.  Further the patient needs to be on PPI therapy indefinitely per Martha Gillespie of Gastroenterology.  Discharge Diagnoses:  Principal Problem:  *Acute respiratory failure with hypoxia Active Problems:  COPD (chronic obstructive pulmonary disease)  Antral ulcer  Diabetes mellitus  Tobacco abuse  Bronchitis, acute  Abdominal pain  Nicotine abuse  Hyperglycemia    Discharge Condition: Much improved.  Breathing easily.  Abdominal pain improved. Diet recommendation: Carb modified diet.  History of present illness:  Patient is a 71 year old female with history of tobacco abuse, Martha Gillespie speaking female accompanied with her granddaughter in the room was initially undergoing chest pain protocol in the [facility]U ED  02/12/2012. History is limited as patient does not speak [place]. Per her granddaughter, the patient was in her baseline health yesterday when she started complaining of Left lower chest/abdominal pain and was brought to the ED. She denied any fevers, chills, productive cough, vomiting or diarrhea. Subsequently while in the ED her oxygen demand was noted to be climbing up in ABG was done today after patient was on liters of oxygen which showed pH of 7.291, PCO2 53.2, PO2 of 57%and patient was placed on NRB mask. Hospitalist service was requested for admission to step down unit for acute hypoxic and hypercarbiac respiratory failure.    Hospital Course:  COPD with exacerbation  The patient's ABG in the emergency room indicated respiratory acidosis  with hypercarbia.  She was treated with scheduled nebulizers, IV levaquin and IV solu-medrol.  Bi-pap was considered, but was ultimately not necessary as the patient's breathing improved.  CT Angiogram was checked and found to be negative for PE.  The patient was able to be moved to the floor.  Her levaquin was discontinued.  Her breathing significantly improved and she was progress to oral steroids.  She was also started on Spiriva and albuterol inhaler.  Respiratory therapy spent time educating the patient on inhaler use.     She has a long history of smoking and was counseled to stop.  Fortunately Martha Gillespie is from [place] and was able to clearly communicate with the patient.   Hyperglycemia  The patient' s cbgs were between 107 and 313 in the hospital. Hgb A1c was 9.6.  She was treated with sliding scale insulin as an inpatient.  She will be discharged on metformin 1000 mg bid with meals.  She will also be advise to check her fasting blood sugar once a day.  The patient will follow with Martha Gillespie for further diabetic management.   Tobacco abuse  She was counseled extensively to quit.  She received transdermal nicotine as an inpatient.    Abdominal Pain Gastroenterology was consulted and performed upper endoscopy.  Two clean based antral ulcers were found.  LFTs were checked and found to be within normal limits.  Complete abdominal ultrasound was performed and found to be negative for sources of abdominal pain.  She did have mild fatty liver.  The patient will be discharged on Protonix q day indefinitely.  Also a serum H Pylori antibody was drawn on 02/15/2012.  These results will need to be followed to determine if the patient needs treatment for H Pylori.   Procedures: Upper Endoscopy, completed 02/14/2012 by Martha Gillespie.  Consultations:  Gastroenterology, Martha Gillespie.  Discharge Exam: Filed Vitals:   02/15/12 0600  BP: 148/81  Pulse: 87  Temp: 98.5 F (36.9 C)    Resp: 18   Filed Vitals:   02/14/12 1710 02/14/12 1734 02/14/12 2100 02/15/12 0600  BP: 127/81 128/74 129/63 148/81  Pulse:   108 87  Temp:  97.9 F (36.6 C) 97.6 F (36.4 C) 98.5 F (36.9 C)  TempSrc:  Oral Oral Oral  Resp: 23 18 20 18   Height:      Weight:      SpO2: 92% 93% 95% 97%   General: Awake, NAD Cardiovascular: RRR no murmurs/rubs/gallops Respiratory: Clear to auscultation, no wheeze, crackles or rals Extremities:  No Lower Ext Edema  Discharge Instructions      Discharge Orders    Future Orders Please Complete By Expires   Diet Carb Modified      Increase activity slowly          Medication List     As of 02/15/2012  2:12 PM    TAKE these medications         albuterol 108 (90 BASE) MCG/ACT inhaler   Commonly known as: PROVENTIL HFA;VENTOLIN HFA   Inhale 2 puffs into the lungs 4 (four) times daily.      metFORMIN 1000 MG tablet   Commonly known as: GLUCOPHAGE   Take 1 tablet (1,000 mg total) by mouth 2 (two) times daily with a meal.      pantoprazole 40 MG tablet   Commonly known as: PROTONIX   Take 1 tablet (40 mg total) by mouth daily at 6 (six) AM.      predniSONE 10 MG tablet   Commonly known as: DELTASONE   Take 3 tablets (30 mg total) by mouth daily with breakfast.      tiotropium 18 MCG inhalation capsule   Commonly known as: SPIRIVA   Place 1 capsule (18 mcg total) into inhaler and inhale daily.         Follow-up Information    Follow up with Millsaps, Martha Millin, NP. On 02/27/2012. (Appointment with Dr. Fredrik Gillespie at 11:30.  Call to change time if needed.)    Contact information:   [facility] [address] [place] [address] [phone]           The results of significant diagnostics from this hospitalization (including imaging, microbiology, ancillary and laboratory) are listed below for reference.    Significant Diagnostic Studies: Dg Chest 2 View  02/12/2012  *RADIOLOGY REPORT*  Clinical  Data: Chest pain  CHEST - 2 VIEW  Comparison: None.  Findings: The lungs are clear without focal infiltrate, edema, pneumothorax or pleural effusion. Interstitial markings are diffusely coarsened with chronic features. The cardiopericardial silhouette is enlarged.  Imaged bony structures of the thorax are intact.  IMPRESSION: Cardiomegaly with chronic interstitial coarsening.  No acute cardiopulmonary process.   Original Report Authenticated By: ERIC A. MANSELL, M.D.    Ct Angio Chest W/cm &/or Wo Cm  02/13/2012  *RADIOLOGY REPORT*  Clinical Data: 71 year old female with chest pain and shortness of breath.  CT ANGIOGRAPHY CHEST  Technique:  Multidetector CT imaging of the chest using the standard protocol during bolus administration of intravenous contrast. Multiplanar reconstructed images including MIPs were obtained and reviewed to evaluate the  vascular anatomy.  Contrast: OMNIPAQUE IOHEXOL 350 MG/ML SOLN  Comparison: 02/12/2012 chest radiograph  Findings: There is a technically satisfactory study.  No pulmonary emboli are identified.  There is no evidence of thoracic aortic aneurysm.  No pleural or pericardial effusions are identified. Mild coronary calcifications are present. Heart size is upper limits of normal. There are no enlarged lymph nodes identified.  There is no evidence of airspace disease, consolidation, suspicious pulmonary nodule/mass or endobronchial/endotracheal lesion. Minimal right basilar scarring/atelectasis identified.  No acute or suspicious bony abnormalities are identified. Diffuse fatty infiltration of the liver is identified.  IMPRESSION: No evidence of acute abnormality - no evidence of pulmonary emboli.  Upper limits of normal heart size.  Fatty infiltration of the liver.   Original Report Authenticated By: [name], M.D.    US Abdomen Complete  02/15/2012  *RADIOLOGY REPORT*  Clinical Data:  Epigastric pain.  Evaluate for gallstones.  COMPLETE ABDOMINAL ULTRASOUND   Comparison:  None.  Findings:  Gallbladder:  Moderate gallbladder sludge without stones or wall thickening.  Normal wall thickness of 2.3 mm.  Negative sonographic Murphy's sign.  Common bile duct:  Normal measuring 4.3 mm.  Liver:  Increased echogenicity consistent with steatosis.  No focal defects or dilated ducts.  IVC:  Appears normal.  Pancreas:  No focal abnormality seen.  Spleen:  Normal in size and echogenicity measuring 7.9 cm.  Right Kidney:  Normal in size and echogenicity measuring 10.5 cm. No stones or hydronephrosis.  Left Kidney:  Normal in size and echogenicity measuring 10.3 cm. No stones or hydronephrosis.  Abdominal aorta:  No aneurysm identified.  IMPRESSION: Gallbladder sludge without stones, pericholecystic fluid, or biliary ductal dilatation.   Original Report Authenticated By: Elsie Stain, M.D.    [name] 2 Views  02/13/2012  *RADIOLOGY REPORT*  Clinical Data: Left lower quadrant abdominal pain.  ABDOMEN - 2 VIEW  Comparison: None.  Findings: Normal bowel gas pattern.  No free peritoneal air. Excreted contrast in the renal collecting systems and urinary bladder.  Lumbar spine degenerative changes and mild scoliosis.  IMPRESSION: No acute abnormality.   Original Report Authenticated By: [name], M.D.     Microbiology: Recent Results (from the past 240 hour(s))  MRSA PCR SCREENING     Status: Normal   Collection Time   02/13/12  6:13 PM      Component Value Range Status Comment   MRSA by PCR NEGATIVE  NEGATIVE Final      Labs: Basic Metabolic Panel:  Lab 02/14/12 6962 02/13/12 1913 02/12/12 2030  NA 135 -- 133*  K 4.5 -- 4.1  CL 101 -- 97  CO2 25 -- 25  GLUCOSE 243* -- 252*  BUN 18 -- 16  CREATININE 0.69 0.71 0.61  CALCIUM 9.4 -- 9.6  MG -- -- --  PHOS -- -- --   Liver Function Tests:  Lab 02/15/12 1023 02/15/12 0610 02/12/12 2030  AST 20 20 25   ALT 22 24 25   ALKPHOS 82 86 110  BILITOT 0.2* 0.2* 0.2*  PROT 6.8 7.0 8.1  ALBUMIN 3.1* 3.2* 3.9     Lab 02/14/12 0610 02/12/12 2030  LIPASE 30 66*  AMYLASE -- --   No results found for this basename: AMMONIA:5 in the last 168 hours CBC:  Lab 02/15/12 1023 02/14/12 0610 02/13/12 1913 02/12/12 2030  WBC 14.7* 13.3* 10.5 12.4*  NEUTROABS -- -- -- --  HGB 11.8* 12.0 12.8 13.2  HCT 35.5* 36.3 38.7 39.1  MCV 85.3 85.8 86.0 84.8  PLT 257 235 256 271   Cardiac Enzymes:  Lab 02/14/12 0610 02/13/12 2337 02/13/12 1913  CKTOTAL 89 94 97  CKMB 2.0 1.9 2.0  CKMBINDEX -- -- --  TROPONINI <0.30 <0.30 <0.30   BNP: BNP (last 3 results) No results found for this basename: PROBNP:3 in the last 8760 hours CBG:  Lab 02/15/12 1156 02/15/12 0748 02/14/12 2122 02/14/12 1738 02/13/12 1253  GLUCAP 190* 157* 313* 263* 107*    Time coordinating discharge: 45 min. Signed:  Algis Downs, PA-C [facility] Pager: [phone]   02/15/2012, 2:12 PM

## 2012-02-15 NOTE — Progress Notes (Signed)
Nsg Discharge Note  Admit Date:  02/12/2012 Discharge date: 02/15/2012   Anthea Maya Blok to be D/C'd Home per MD order.  AVS completed.  Copy for chart, and copy for patient signed, and dated. Patient/caregiver able to verbalize understanding.  Discharge Medication:  Ryleigh, Esqueda [facility] Medication Instructions ZOX:[id number]   Printed on:02/15/12 1705  Medication Information                    pantoprazole (PROTONIX) 40 MG tablet Take 1 tablet (40 mg total) by mouth daily at 6 (six) AM.           predniSONE (DELTASONE) 10 MG tablet Take 3 tablets (30 mg total) by mouth daily with breakfast.           tiotropium (SPIRIVA) 18 MCG inhalation capsule Place 1 capsule (18 mcg total) into inhaler and inhale daily.           metFORMIN (GLUCOPHAGE) 1000 MG tablet Take 1 tablet (1,000 mg total) by mouth 2 (two) times daily with a meal.           albuterol (PROVENTIL HFA;VENTOLIN HFA) 108 (90 BASE) MCG/ACT inhaler Inhale 2 puffs into the lungs 4 (four) times daily.             Discharge Assessment: Filed Vitals:   02/15/12 1437  BP: 144/79  Pulse: 98  Temp: 98.1 F (36.7 C)  Resp: 18   Skin clean, dry and intact without evidence of skin break down, no evidence of skin tears noted. IV catheter discontinued intact. Site without signs and symptoms of complications - no redness or edema noted at insertion site, patient denies c/o pain - only slight tenderness at site.  Dressing with slight pressure applied.  D/c Instructions-Education: Discharge instructions given to patient/family with verbalized understanding. D/c education completed with patient/family including follow up instructions, medication list, d/c activities limitations if indicated, with other d/c instructions as indicated by MD - patient able to verbalize understanding, all questions fully answered. Patient instructed to return to ED, call 911, or call MD for any changes in condition.  Patient escorted via WC, and D/C  home via private auto.  Brealynn Contino [name], RN 02/15/2012 5:05 PM

## 2012-02-15 NOTE — Progress Notes (Signed)
Inpatient Diabetes Program Recommendations  AACE/ADA: New Consensus Statement on Inpatient Glycemic Control (2013)  Target Ranges:  Prepandial:   less than 140 mg/dL      Peak postprandial:   less than 180 mg/dL (1-2 hours)      Critically ill patients:  140 - 180 mg/dL   Reason for Visit: Elevated HgbA1C   Results for Martha Gillespie, Martha Gillespie (MRN [id number]) as of 02/15/2012 10:54  Ref. Range 02/13/2012 12:53 02/14/2012 17:38 02/14/2012 21:22 02/15/2012 07:48  Glucose-Capillary Latest Range: 70-99 mg/dL 409 (H) 811 (H) 914 (H) 157 (H)   ?Newly- diagnosed DM  MD - do you want [place] to begin educating pt on basic diabetes information? Would also recommend changing diet to CHO-mod medium.  Thank you.  Ailene Ards, RD, LDN, CDE Inpatient Diabetes Coordinator [phone]

## 2012-02-15 NOTE — Progress Notes (Signed)
Subjective: No acute events.  Epigastric abdominal tenderness.  Objective: Vital signs in last 24 hours: Temp:  [97.6 F (36.4 C)-98.7 F (37.1 C)] 98.5 F (36.9 C) (10/02 0600) Pulse Rate:  [86-108] 87  (10/02 0600) Resp:  [17-25] 18  (10/02 0600) BP: (117-148)/(63-82) 148/81 mmHg (10/02 0600) SpO2:  [91 %-97 %] 97 % (10/02 0600)    Intake/Output from previous day: 10/01 0701 - 10/02 0700 In: 863 [P.O.:840; I.V.:23] Out: -  Intake/Output this shift:    General appearance: alert and no distress GI: mild epigastric tenderness  Lab Results:  Basename 02/14/12 0610 02/13/12 1913 02/12/12 2030  WBC 13.3* 10.5 12.4*  HGB 12.0 12.8 13.2  HCT 36.3 38.7 39.1  PLT 235 256 271   BMET  Basename 02/14/12 0610 02/13/12 1913 02/12/12 2030  NA 135 -- 133*  K 4.5 -- 4.1  CL 101 -- 97  CO2 25 -- 25  GLUCOSE 243* -- 252*  BUN 18 -- 16  CREATININE 0.69 0.71 0.61  CALCIUM 9.4 -- 9.6   LFT  Basename 02/15/12 0610  PROT 7.0  ALBUMIN 3.2*  AST 20  ALT 24  ALKPHOS 86  BILITOT 0.2*  BILIDIR <0.1  IBILI NOT CALCULATED   PT/INR No results found for this basename: LABPROT:2,INR:2 in the last 72 hours Hepatitis Panel No results found for this basename: HEPBSAG,HCVAB,HEPAIGM,HEPBIGM in the last 72 hours C-Diff No results found for this basename: CDIFFTOX:3 in the last 72 hours Fecal Lactopherrin No results found for this basename: FECLLACTOFRN in the last 72 hours  Studies/Results: Ct Angio Chest W/cm &/or Wo Cm  02/13/2012  *RADIOLOGY REPORT*  Clinical Data: 71 year old female with chest pain and shortness of breath.  CT ANGIOGRAPHY CHEST  Technique:  Multidetector CT imaging of the chest using the standard protocol during bolus administration of intravenous contrast. Multiplanar reconstructed images including MIPs were obtained and reviewed to evaluate the vascular anatomy.  Contrast: OMNIPAQUE IOHEXOL 350 MG/ML SOLN  Comparison: 02/12/2012 chest radiograph  Findings:  There is a technically satisfactory study.  No pulmonary emboli are identified.  There is no evidence of thoracic aortic aneurysm.  No pleural or pericardial effusions are identified. Mild coronary calcifications are present. Heart size is upper limits of normal. There are no enlarged lymph nodes identified.  There is no evidence of airspace disease, consolidation, suspicious pulmonary nodule/mass or endobronchial/endotracheal lesion. Minimal right basilar scarring/atelectasis identified.  No acute or suspicious bony abnormalities are identified. Diffuse fatty infiltration of the liver is identified.  IMPRESSION: No evidence of acute abnormality - no evidence of pulmonary emboli.  Upper limits of normal heart size.  Fatty infiltration of the liver.   Original Report Authenticated By: Martha Gillespie, M.Gillespie.    [name] 2 Views  02/13/2012  *RADIOLOGY REPORT*  Clinical Data: Left lower quadrant abdominal pain.  ABDOMEN - 2 VIEW  Comparison: None.  Findings: Normal bowel gas pattern.  No free peritoneal air. Excreted contrast in the renal collecting systems and urinary bladder.  Lumbar spine degenerative changes and mild scoliosis.  IMPRESSION: No acute abnormality.   Original Report Authenticated By: Martha Gillespie, M.Gillespie.     Medications:  Scheduled:   . albuterol  2 puff Inhalation QID  . antiseptic oral rinse  15 mL Mouth Rinse BID  . enoxaparin (LOVENOX) injection  40 mg Subcutaneous Q24H  . insulin aspart  0-5 Units Subcutaneous QHS  . insulin aspart  0-9 Units Subcutaneous TID WC  . nicotine  21 mg  Transdermal Daily  . pantoprazole  40 mg Oral Q0600  . predniSONE  40 mg Oral Q breakfast  . sodium chloride  3 mL Intravenous Q12H  . tiotropium  18 mcg Inhalation Daily  . DISCONTD: albuterol  2.5 mg Nebulization TID  . DISCONTD: albuterol-ipratropium  2 puff Inhalation Q4H  . DISCONTD: albuterol-ipratropium  2 puff Inhalation Q4H WA  . DISCONTD: budesonide  0.25 mg Nebulization BID  . DISCONTD:  ipratropium  0.5 mg Nebulization TID  . DISCONTD: levofloxacin (LEVAQUIN) IV  750 mg Intravenous Q24H  . DISCONTD: methylPREDNISolone (SOLU-MEDROL) injection  60 mg Intravenous Q6H  . DISCONTD: methylPREDNISolone (SOLU-MEDROL) injection  40 mg Intravenous Q8H  . DISCONTD: methylPREDNISolone (SOLU-MEDROL) injection  40 mg Intravenous Q12H  . DISCONTD: tiotropium  18 mcg Inhalation Daily   Continuous:   . DISCONTD: sodium chloride 10 mL/hr (02/14/12 1519)    Assessment/Plan: 1) Two clean-based antral ulcers. 2) Mild epigastric pain.   No acute events.  The RUQ U/S and liver panel are pending.  Plan: 1) Continue with Protonix. 2) Check H. Pylori Ab. 3) Await ultrasound results.  LOS: 3 days   Martha Gillespie 02/15/2012, 7:51 AM

## 2012-02-15 NOTE — Progress Notes (Signed)
Patient given education on Diabetes, COPD and GI ulcers with me, Irania Durell. Called [place] interpreter used interpreter#[id number].  Patient given inhaler education with Herbert Seta, RT through [facility] interpreters @ [phone].

## 2012-02-16 NOTE — OR Nursing (Signed)
Late entry for 02/14/2012: Pt was given sedation during endoscopy procedure by Angelique Blonder, RN.  Pt was given 25 mcg of fentanyl and 3 mg of Versed.  There was no one left in the department to witness medication wasting.  75 mcg of fentanyl and 2 mg of Versed were wasted in sink.  Spoke with sadie in pharmacy, who gave instructions to write progress note as waste was not able to be documented in pyxis, as pt had been discharged.  Angelique Blonder, RN

## 2013-10-08 ENCOUNTER — Emergency Department (HOSPITAL_COMMUNITY): Payer: Medicaid Other

## 2013-10-08 ENCOUNTER — Encounter (HOSPITAL_COMMUNITY): Payer: Self-pay | Admitting: Emergency Medicine

## 2013-10-08 ENCOUNTER — Emergency Department (HOSPITAL_COMMUNITY)
Admission: EM | Admit: 2013-10-08 | Discharge: 2013-10-08 | Disposition: A | Discharge status: Home / Self Care | Payer: Medicaid Other | Attending: Emergency Medicine | Admitting: Emergency Medicine

## 2013-10-08 DIAGNOSIS — S8262XA Displaced fracture of lateral malleolus of left fibula, initial encounter for closed fracture: Secondary | ICD-10-CM

## 2013-10-08 DIAGNOSIS — I10 Essential (primary) hypertension: Secondary | ICD-10-CM | POA: Insufficient documentation

## 2013-10-08 DIAGNOSIS — Z79899 Other long term (current) drug therapy: Secondary | ICD-10-CM | POA: Insufficient documentation

## 2013-10-08 DIAGNOSIS — Y9301 Activity, walking, marching and hiking: Secondary | ICD-10-CM | POA: Insufficient documentation

## 2013-10-08 DIAGNOSIS — X500XXA Overexertion from strenuous movement or load, initial encounter: Secondary | ICD-10-CM | POA: Insufficient documentation

## 2013-10-08 DIAGNOSIS — Y929 Unspecified place or not applicable: Secondary | ICD-10-CM | POA: Insufficient documentation

## 2013-10-08 DIAGNOSIS — S8263XA Displaced fracture of lateral malleolus of unspecified fibula, initial encounter for closed fracture: Secondary | ICD-10-CM | POA: Insufficient documentation

## 2013-10-08 DIAGNOSIS — IMO0002 Reserved for concepts with insufficient information to code with codable children: Secondary | ICD-10-CM | POA: Insufficient documentation

## 2013-10-08 DIAGNOSIS — F17200 Nicotine dependence, unspecified, uncomplicated: Secondary | ICD-10-CM | POA: Insufficient documentation

## 2013-10-08 DIAGNOSIS — E119 Type 2 diabetes mellitus without complications: Secondary | ICD-10-CM | POA: Insufficient documentation

## 2013-10-08 MED ORDER — ACETAMINOPHEN 325 MG PO TABS
650.0000 mg | ORAL_TABLET | Freq: Once | ORAL | Status: AC
  Administered 2013-10-08: 650 mg via ORAL
  Filled 2013-10-08: qty 2

## 2013-10-08 MED ORDER — ACETAMINOPHEN 500 MG PO TABS: 1000.0000 mg | ORAL_TABLET | Freq: Four times a day (QID) | ORAL | Status: DC | PRN

## 2013-10-08 NOTE — ED Provider Notes (Signed)
CSN: [id number]     Arrival date & time 10/08/13  1849 History   None    This chart was scribed for non-physician practitioner, Coral CeoJessica Criselda Starke, PA-C working with Gilda Creasehristopher J. Pollina, * by Arlan OrganAshley Leger, ED Scribe. This patient was seen in room TR05C/TR05C and the patient's care was started at 9:51 PM.   Chief Complaint  Patient presents with  . Ankle Pain   The history is provided by the patient. A language interpreter was used.    HPI Comments: Martha Maya Ardyth Galamang is a 73 y.o. Female with a PMHx of HTN and DM who presents to the Emergency Department complaining of constant, moderate L ankle pain onset this morning. Pt states slipped down the stairs and twisted her ankle and leg. No head trauma, LOC or syncope. Patient complains of swelling and bruising to the left lateral ankle.She denies any knee, hip or foot pain. She states this pain is exacerbated with weight bearing and ambulation. She has not tried anything OTC or any home remedies for symptoms. At this time she denies any fever, chills, numbness, tingling, or loss of sensation. No other concerns this visit. Patient ambulates without assistive devices at baseline.    Past Medical History  Diagnosis Date  . Hypertension   . Diabetes mellitus 02/15/2012   Past Surgical History  Procedure Laterality Date  . Esophagogastroduodenoscopy  02/14/2012    Procedure: ESOPHAGOGASTRODUODENOSCOPY (EGD);  Surgeon: Theda BelfastPatrick D Hung, MD;  [facility];  Service: Endoscopy;  Laterality: N/A;   No family history on file. History  Substance Use Topics  . Smoking status: Current Some Day Smoker  . Smokeless tobacco: Not on file  . Alcohol Use: No   OB History   Grav Para Term Preterm Abortions TAB SAB Ect Mult Living                 Review of Systems  Constitutional: Negative for fever, chills, activity change, appetite change and fatigue.  Cardiovascular: Negative for leg swelling.  Gastrointestinal: Negative for nausea, vomiting and  abdominal pain.  Musculoskeletal: Positive for arthralgias (L ankle), gait problem (due to pain) and joint swelling (L ankle). Negative for back pain, myalgias and neck pain.  Skin: Positive for color change. Negative for rash and wound.  Neurological: Negative for dizziness, syncope, weakness, light-headedness, numbness and headaches.  Psychiatric/Behavioral: Negative for confusion.   Allergies  Review of patient's allergies indicates no known allergies.  Home Medications   Prior to Admission medications   Medication Sig Start Date End Date Taking? Authorizing Provider  albuterol (PROVENTIL HFA;VENTOLIN HFA) 108 (90 BASE) MCG/ACT inhaler Inhale 2 puffs into the lungs 4 (four) times daily. 02/15/12   Tora KindredMarianne L York, PA-C  metFORMIN (GLUCOPHAGE) 1000 MG tablet Take 1 tablet (1,000 mg total) by mouth 2 (two) times daily with a meal. 02/15/12   Tora KindredMarianne L York, PA-C  pantoprazole (PROTONIX) 40 MG tablet Take 1 tablet (40 mg total) by mouth daily at 6 (six) AM. 02/15/12   Tora KindredMarianne L York, PA-C  predniSONE (DELTASONE) 10 MG tablet Take 3 tablets (30 mg total) by mouth daily with breakfast. 02/15/12   Stephani PoliceMarianne L York, PA-C  tiotropium (SPIRIVA) 18 MCG inhalation capsule Place 1 capsule (18 mcg total) into inhaler and inhale daily. 02/15/12   Stephani PoliceMarianne L York, PA-C   Triage Vitals: BP 120/71  Pulse 86  Temp(Src) 98.4 F (36.9 C) (Oral)  Resp 18  SpO2 94%   Filed Vitals:   10/08/13 1938 10/08/13 2245  BP: 120/71 113/64  Pulse: 86 79  Temp: 98.4 F (36.9 C) 97.8 F (36.6 C)  TempSrc: Oral Oral  Resp: 18 15  SpO2: 94% 98%    Physical Exam  Nursing note and vitals reviewed. Constitutional: She is oriented to person, place, and time. She appears well-developed and well-nourished. No distress.  HENT:  Head: Normocephalic and atraumatic.  Right Ear: External ear normal.  Left Ear: External ear normal.  Nose: Nose normal.  Mouth/Throat: Oropharynx is clear and moist.  Eyes: Conjunctivae  are normal. Right eye exhibits no discharge. Left eye exhibits no discharge.  Neck: Normal range of motion.  Cardiovascular: Normal rate, regular rhythm, normal heart sounds and intact distal pulses.  Exam reveals no gallop and no friction rub.   No murmur heard. Dorsalis pedis pulses present and equal bilaterally  Pulmonary/Chest: Effort normal and breath sounds normal. No respiratory distress. She has no wheezes. She has no rales. She exhibits no tenderness.  Abdominal: Soft. She exhibits no distension. There is no tenderness.  Musculoskeletal: Normal range of motion. She exhibits edema and tenderness.       Feet:  Diffuse tenderness to palpation to the left lateral malleolus with associated edema and mild ecchymosis. No erythema or wounds. No foot, calf, knee, or hip tenderness to palpation on the left.   Neurological: She is alert and oriented to person, place, and time.  Sensation intact in the LE bilaterally  Skin: Skin is warm and dry. She is not diaphoretic.  Psychiatric: She has a normal mood and affect.    ED Course  Procedures (including critical care time)  DIAGNOSTIC STUDIES: Oxygen Saturation is 94% on RA, adequate by my interpretation.    COORDINATION OF CARE: 9:59 PM- Will order DG Ankle Complete L. Will give pain medication. Discussed treatment plan with pt at bedside and pt agreed to plan.     Labs Review Labs Reviewed - No data to display  Imaging Review Dg Ankle Complete Left  10/08/2013   CLINICAL DATA:  Twist injury this morning while walking, pain and swelling at ankle  EXAM: LEFT ANKLE COMPLETE - 3+ VIEW  COMPARISON:  None  FINDINGS: Soft tissue swelling at ankle diffusely.  Osseous demineralization.  Ankle mortise intact.  Nondisplaced fracture at tip of lateral malleolus.  No additional fracture, dislocation or bone destruction.  IMPRESSION: Nondisplaced fracture at tip of lateral malleolus.   Electronically Signed   By: Ulyses Southward M.D.   On: 10/08/2013 21:35      EKG Interpretation None      MDM   Martha Gillespie is a 73 y.o. female with a PMHx of HTN and DM who presents to the Emergency Department complaining of constant, moderate L ankle pain onset this morning. Patient found to have a non-displaced fracture of the distal lateral malleolus. Patient neurovascularly intact. Placed in CAM walker boot and referred to orthopedics. Prescription for walker given. Will treat pain with Tylenol, which was given in the ED with improvements in pain. Due to age and instability did not feel narcotics or crutches would be a good option for the patient at this time. Family member in ED. Discussed care and plan with patient and family. Return precautions, discharge instructions, and follow-up was discussed with the patient before discharge.      Discharge Medication List as of 10/08/2013 10:49 PM    START taking these medications   Details  acetaminophen (TYLENOL) 500 MG tablet Take 2 tablets (1,000 mg total) by mouth  every 6 (six) hours as needed., Starting 10/08/2013, Until Discontinued, Print         Final impressions: 1. Fracture of lateral malleolus of left ankle       Luiz Iron PA-C   This patient was discussed with Dr. Blinda Leatherwood   I personally performed the services described in this documentation, which was scribed in my presence. The recorded information has been reviewed and is accurate.    Jillyn Ledger, PA-C 10/10/13 [phone]

## 2013-10-08 NOTE — Progress Notes (Signed)
Orthopedic Tech Progress Note Patient Details:  Martha Gillespie Apr 21, 1941 [id number]  Ortho Devices Type of Ortho Device: CAM walker Ortho Device/Splint Location: LLE Ortho Device/Splint Interventions: Ordered;Application   Jennye Moccasin 10/08/2013, 10:18 PM

## 2013-10-08 NOTE — ED Notes (Signed)
Pt. twisted her left ankle while walking this morning , presents with pain /swelling at left ankle .

## 2013-10-08 NOTE — Discharge Instructions (Signed)
Use RICE method for pain - see below Take Tylenol 1,000 mg up to 4 times daily - do not take more than 4,000 mg or 4 grams  Return to the emergency department if you develop any changing/worsening condition or any other concerns (please read additional information regarding your condition below)    Ankle Pain Ankle pain is a common symptom. The bones, cartilage, tendons, and muscles of the ankle joint perform a lot of work each day. The ankle joint holds your body weight and allows you to move around. Ankle pain can occur on either side or back of 1 or both ankles. Ankle pain may be sharp and burning or dull and aching. There may be tenderness, stiffness, redness, or warmth around the ankle. The pain occurs more often when a person walks or puts pressure on the ankle. CAUSES  There are many reasons ankle pain can develop. It is important to work with your caregiver to identify the cause since many conditions can impact the bones, cartilage, muscles, and tendons. Causes for ankle pain include:  Injury, including a break (fracture), sprain, or strain often due to a fall, sports, or a high-impact activity.  Swelling (inflammation) of a tendon (tendonitis).  Achilles tendon rupture.  Ankle instability after repeated sprains and strains.  Poor foot alignment.  Pressure on a nerve (tarsal tunnel syndrome).  Arthritis in the ankle or the lining of the ankle.  Crystal formation in the ankle (gout or pseudogout). DIAGNOSIS  A diagnosis is based on your medical history, your symptoms, results of your physical exam, and results of diagnostic tests. Diagnostic tests may include X-ray exams or a computerized magnetic scan (magnetic resonance imaging, MRI). TREATMENT  Treatment will depend on the cause of your ankle pain and may include:  Keeping pressure off the ankle and limiting activities.  Using crutches or other walking support (a cane or brace).  Using rest, ice, compression, and  elevation.  Participating in physical therapy or home exercises.  Wearing shoe inserts or special shoes.  Losing weight.  Taking medications to reduce pain or swelling or receiving an injection.  Undergoing surgery. HOME CARE INSTRUCTIONS   Only take over-the-counter or prescription medicines for pain, discomfort, or fever as directed by your caregiver.  Put ice on the injured area.  Put ice in a plastic bag.  Place a towel between your skin and the bag.  Leave the ice on for 15-20 minutes at a time, 03-04 times a day.  Keep your leg raised (elevated) when possible to lessen swelling.  Avoid activities that cause ankle pain.  Follow specific exercises as directed by your caregiver.  Record how often you have ankle pain, the location of the pain, and what it feels like. This information may be helpful to you and your caregiver.  Ask your caregiver about returning to work or sports and whether you should drive.  Follow up with your caregiver for further examination, therapy, or testing as directed. SEEK MEDICAL CARE IF:   Pain or swelling continues or worsens beyond 1 week.  You have an oral temperature above 102 F (38.9 C).  You are feeling unwell or have chills.  You are having an increasingly difficult time with walking.  You have loss of sensation or other new symptoms.  You have questions or concerns. MAKE SURE YOU:   Understand these instructions.  Will watch your condition.  Will get help right away if you are not doing well or get worse. Document Released: 10/20/2009  Document Revised: 07/25/2011 Document Reviewed: 10/20/2009 [facility] Patient Information 2014 [place], [place].  RICE: Routine Care for Injuries The routine care of many injuries includes Rest, Ice, Compression, and Elevation (RICE). HOME CARE INSTRUCTIONS  Rest is needed to allow your body to heal. Routine activities can usually be resumed when comfortable. Injured tendons and bones can  take up to 6 weeks to heal. Tendons are the cord-like structures that attach muscle to bone.  Ice following an injury helps keep the swelling down and reduces pain.  Put ice in a plastic bag.  Place a towel between your skin and the bag.  Leave the ice on for 15-20 minutes, 03-04 times a day. Do this while awake, for the first 24 to 48 hours. After that, continue as directed by your caregiver.  Compression helps keep swelling down. It also gives support and helps with discomfort. If an elastic bandage has been applied, it should be removed and reapplied every 3 to 4 hours. It should not be applied tightly, but firmly enough to keep swelling down. Watch fingers or toes for swelling, bluish discoloration, coldness, numbness, or excessive pain. If any of these problems occur, remove the bandage and reapply loosely. Contact your caregiver if these problems continue.  Elevation helps reduce swelling and decreases pain. With extremities, such as the arms, hands, legs, and feet, the injured area should be placed near or above the level of the heart, if possible. SEEK IMMEDIATE MEDICAL CARE IF:  You have persistent pain and swelling.  You develop redness, numbness, or unexpected weakness.  Your symptoms are getting worse rather than improving after several days. These symptoms may indicate that further evaluation or further X-rays are needed. Sometimes, X-rays may not show a small broken bone (fracture) until 1 week or 10 days later. Make a follow-up appointment with your caregiver. Ask when your X-ray results will be ready. Make sure you get your X-ray results. Document Released: 08/14/2000 Document Revised: 07/25/2011 Document Reviewed: 10/01/2010 [facility] Patient Information 2014 [place], [place].   Emergency Department Resource Guide 1) Find a Doctor and Pay Out of Pocket Although you won't have to find out who is covered by your insurance plan, it is a good idea to ask around and get  recommendations. You will then need to call the office and see if the doctor you have chosen will accept you as a new patient and what types of options they offer for patients who are self-pay. Some doctors offer discounts or will set up payment plans for their patients who do not have insurance, but you will need to ask so you aren't surprised when you get to your appointment.  2) Contact Your Local Health Department Not all health departments have doctors that can see patients for sick visits, but many do, so it is worth a call to see if yours does. If you don't know where your local health department is, you can check in your phone book. The CDC also has a tool to help you locate your state's health department, and many state websites also have listings of all of their local health departments.  3) Find a Walk-in Clinic If your illness is not likely to be very severe or complicated, you may want to try a walk in clinic. These are popping up all over the country in pharmacies, drugstores, and shopping centers. They're usually staffed by nurse practitioners or physician assistants that have been trained to treat common illnesses and complaints. They're usually fairly quick and inexpensive.  However, if you have serious medical issues or chronic medical problems, these are probably not your best option.  No Primary Care Doctor: - Call Health Connect at  [phone] - they can help you locate a primary care doctor that  accepts your insurance, provides certain services, etc. - Physician Referral Service- [phone]  Chronic Pain Problems: Organization         Address  Phone   Notes  [facility]  [phone] Patients need to be referred by their primary care doctor.   Medication Assistance: Organization         Address  Phone   Notes  [facility] Medication [facility] [address] [place] [place], [place] [address] [phone] --Must be a resident of  [facility] -- Must have NO insurance coverage whatsoever (no Medicaid/ Medicare, etc.) -- The pt. MUST have a primary care doctor that directs their care regularly and follows them in the community   [facility]  [phone]   [employer]  [phone]    Agencies that provide inexpensive medical care: Organization         Address  Phone   Notes  [facility]  [phone]   [facility]    [phone]   [facility] [address] [place], [place] [address] [phone]   [facility] [address] [place] [place] [phone]   Planned Parenthood    [phone]   [facility]    [phone]   Community [facility] and [facility]  [address], [place] Phone:  [phone], Fax:  [phone] Hours of Operation:  9 am - 6 pm, M-F.  Also accepts Medicaid/Medicare and self-pay.  [facility]  [address], [place] Phone: [phone], Fax: [phone]. Hours of Operation:  8:30 am - 5:30 pm, M-F.  Also accepts Medicaid and self-pay.  [facility] [place] [address] [place] Phone: [phone]   [facility] [address] Natasha Bence [place], [place] [phone], Ext. [phone] Mondays & Thursdays: 7-9 AM.  First 15 patients are seen on a first come, first serve basis.    Medicaid-accepting [facility] Providers:  Organization         Address  Phone   Notes  [facility] [address], [place] [phone] Also accepts self-pay patients.  [facility] [address] Laurell Josephs [place], [place]  [phone]   [facility] [address], [place] [phone]   [facility] [address], [place] [phone]   Renaye Rakers [address], [place]   [phone] Only accepts [place] Access  [place] patients after they have their name applied to their card.   Self-Pay (no insurance) in [facility]:  Organization         Address  Phone   Notes  Sickle Cell Patients, [facility] [address] [place], [place] [phone]   [facility] [address] [place], [place] [phone]   [facility] [place]  [address] [place] [address],  [phone]   [facility]/Dr. Osei-Bonsu  [address], [place] or [address], [place] [phone] Phone number for both [place] and [place] locations is the same.  [facility]gent Medical and [facility] [address], [place] [phone]   [facility] [place] [address] [place] [address], [place] or [address] [phone]   [facility] [address], [place] [phone], phone; [phone]) [phone], fax Sees patients 1st and 3rd Saturday of every month.  Must not qualify for public or private insurance (i.e. Medicaid, Medicare, [place] Health Choice, Veterans' Benefits)  Household income should be no more than 200% of the poverty level The clinic cannot treat you if you are pregnant or think you are pregnant  Sexually transmitted diseases are not treated at the clinic.    Dental Care: Organization         Address  Phone  Notes  [facility] [address], [place] [phone] Accepts children up to age 71 who are enrolled in [place] or [place] Health Choice; pregnant women with a Medicaid card; and children who have applied for Medicaid or [place] Health Choice, but were declined, whose parents can pay a reduced fee at time of service.  [facility] [place]  [address], [place] [phone] Accepts children up to age 71 who are enrolled in [place] or [place] Health Choice; pregnant women with a Medicaid  card; and children who have applied for Medicaid or [place] Health Choice, but were declined, whose parents can pay a reduced fee at time of service.  [facility]  [address], [place] [phone] Patients are seen by appointment only. Walk-ins are not accepted. [facility] will see patients 71 years of age and older. Monday - Tuesday (8am-5pm) Most Wednesdays (8:30-5pm) $30 per visit, cash only  [facility]  [address], [facility] [place] [phone] Patients are seen by appointment only. Walk-ins are not accepted. [facility] will see patients 71 years of age and older. One Wednesday Evening (Monthly: Volunteer Based).  $30 per visit, cash only  [employer] [facility] [facility]  [phone]) [phone] for adults; Children under age 514, call Graduate Pediatric Dentistry at [phone]. Children aged 474-14, please call [phone]) [phone] to request a pediatric application.  Dental services are provided in all areas of dental care including fillings, crowns and bridges, complete and partial dentures, implants, gum treatment, root canals, and extractions. Preventive care is also provided. Treatment is provided to both adults and children. Patients are selected via a lottery and there is often a waiting list.   [facility] [address], [place]  [phone] [email or web address]   [facility] [address], [place], [place] [phone], Ext. [phone] Second and Fourth Thursday of each month, opens at 6:30 AM; Clinic ends at 9 AM.  Patients are seen on a first-come first-served basis, and a limited number are seen during each clinic.   [facility]  [address], [place], [place] [phone]) [phone]   Eligibility Requirements You must have lived in [place], [place], or [place] counties for at least the last three months.   You cannot be eligible for state or federal sponsored [place] Cityhealthcare insurance,  including CIGNAVeterans Administration, [place]icaid, or [employer]icare.   You generally cannot be eligible for healthcare insurance through your employer.    How to apply: Eligibility screenings are held every Tuesday and Wednesday afternoon from 1:00 pm until 4:00 pm. You  do not need an appointment for the interview!  [facility] [address], [place], [place] [phone]   [facility] Health Department  [phone]   [facility] Health Department  [phone]   [facility] Health Department  [phone]    Behavioral Health Resources in the Community: Intensive Outpatient Programs Organization         Address  Phone  Notes  [facility] Services [address] [place], [place] [phone]   [facility] Outpatient [address], [place], [place] [phone]   [facility] [address] [place], [place]  [phone]   [facility] Mental Health [address]  [place], [place] [phone] or [phone]   Substance Abuse Resources Organization         Address  Phone  Notes  Al[facility] Drug Services  [phone]   [facility]  [phone]   [facility]  [phone]   Floydene Flock  [phone]   Residential & Outpatient Substance Abuse Program  [phone]   Psychological Services Organization         Address  Phone  Notes  [facility] Behavior[facility] Health  [phone]   [facility] Services  [phone]   [facility] Mental Health [address], [place] [phone] or [phone]    Mobile Crisis Teams Organization         Address  Phone  Notes  [facility], Mobile Crisis Care Unit  [phone]   [facility]  [address] [place], [place] [phone]   Doristine Locks [address] [place] [phone]    Self-Help/Support Groups Organization         Address  Phone             Notes  Mental Health Assoc.  of [place] - variety of support groups  336- I7437963 Call for more information  Narcotics Anonymous (NA), [facility]aring [facility] [address], [employer] [place]  2 meetings at this location   Statistician         Address  Phone  Notes  [facility] [address],    [place]  [phone]   [facility]  [address] [place] [address], [place], [place] [phone]   [facility] [address] [place], [place] [phone] Admissions: 8am-3pm M-F  [facility] [address],    [place], [place] [phone]   [facility] [address] [place], [place], [place] [phone]   [facility] [address]  [place], [place] [phone]   [facility] - Intensive Outpatient [address], [place], [place] [phone]   [facility] (Addiction Recovery [facility] [address] [place]  [place], [place] [phone] or [phone]   [facility] ([facility]) [address] [place], [place] [phone] Accepts Medicaid  [facility] [address] [place] [address],  [place] [phone] Substance Abuse/Addiction Treatment   [facility] Organization         Address  Phone  Notes  [facility]  [phone]   Angie Fava, PhD [address] Ervin Knack [place], [place]   [phone] or [phone]   [facility] Behavioral   [address] [place], [place] [phone]   [facility] [address] [place], [place] [phone] Insurance/Medicaid/sponsorship through [employer] and Families [address]  [place], [place] [phone] [place], [place] [phone]    Dr. Adele Schilder  [phone]   [facility] Dept. 1) [address] [place] 2)  [place] 3)  [place] [address], [place] [phone]  [phone]   [place] [phone] or [phone] (After Hours)

## 2013-10-10 NOTE — ED Provider Notes (Signed)
Medical screening examination/treatment/procedure(s) were performed by non-physician practitioner and as supervising physician I was immediately available for consultation/collaboration.  Christopher J. Pollina, MD 10/10/13 1651 

## 2014-06-26 ENCOUNTER — Encounter (HOSPITAL_COMMUNITY): Payer: Self-pay | Admitting: Emergency Medicine

## 2014-06-26 ENCOUNTER — Emergency Department (HOSPITAL_COMMUNITY): Payer: Medicaid Other

## 2014-06-26 ENCOUNTER — Inpatient Hospital Stay (HOSPITAL_COMMUNITY)
Admission: EM | Admit: 2014-06-26 | Discharge: 2014-06-28 | DRG: 190 | Disposition: A | Discharge status: Home / Self Care | Payer: Medicaid Other | Attending: Internal Medicine | Admitting: Internal Medicine

## 2014-06-26 DIAGNOSIS — E119 Type 2 diabetes mellitus without complications: Secondary | ICD-10-CM | POA: Diagnosis present

## 2014-06-26 DIAGNOSIS — J9601 Acute respiratory failure with hypoxia: Secondary | ICD-10-CM | POA: Diagnosis present

## 2014-06-26 DIAGNOSIS — I10 Essential (primary) hypertension: Secondary | ICD-10-CM | POA: Diagnosis present

## 2014-06-26 DIAGNOSIS — E785 Hyperlipidemia, unspecified: Secondary | ICD-10-CM | POA: Diagnosis present

## 2014-06-26 DIAGNOSIS — Z7982 Long term (current) use of aspirin: Secondary | ICD-10-CM | POA: Diagnosis not present

## 2014-06-26 DIAGNOSIS — F17210 Nicotine dependence, cigarettes, uncomplicated: Secondary | ICD-10-CM | POA: Diagnosis present

## 2014-06-26 DIAGNOSIS — A084 Viral intestinal infection, unspecified: Secondary | ICD-10-CM | POA: Diagnosis present

## 2014-06-26 DIAGNOSIS — R0602 Shortness of breath: Secondary | ICD-10-CM | POA: Diagnosis present

## 2014-06-26 DIAGNOSIS — J441 Chronic obstructive pulmonary disease with (acute) exacerbation: Principal | ICD-10-CM | POA: Diagnosis present

## 2014-06-26 DIAGNOSIS — R030 Elevated blood-pressure reading, without diagnosis of hypertension: Secondary | ICD-10-CM

## 2014-06-26 DIAGNOSIS — J449 Chronic obstructive pulmonary disease, unspecified: Secondary | ICD-10-CM | POA: Diagnosis present

## 2014-06-26 DIAGNOSIS — Z79899 Other long term (current) drug therapy: Secondary | ICD-10-CM | POA: Diagnosis not present

## 2014-06-26 DIAGNOSIS — J439 Emphysema, unspecified: Secondary | ICD-10-CM

## 2014-06-26 LAB — COMPREHENSIVE METABOLIC PANEL
ALT: 22 U/L (ref 0–35)
AST: 26 U/L (ref 0–37)
Albumin: 3.7 g/dL (ref 3.5–5.2)
Alkaline Phosphatase: 89 U/L (ref 39–117)
Anion gap: 7 (ref 5–15)
BUN: 21 mg/dL (ref 6–23)
CALCIUM: 8.5 mg/dL (ref 8.4–10.5)
CO2: 27 mmol/L (ref 19–32)
Chloride: 101 mmol/L (ref 96–112)
Creatinine, Ser: 0.69 mg/dL (ref 0.50–1.10)
GFR calc Af Amer: >90 mL/min (ref >90)
GFR calc non Af Amer: 85 mL/min — ABNORMAL LOW (ref >90)
Glucose, Bld: 144 mg/dL — ABNORMAL HIGH (ref 70–99)
Potassium: 3.7 mmol/L (ref 3.5–5.1)
Sodium: 135 mmol/L (ref 135–145)
TOTAL PROTEIN: 7.1 g/dL (ref 6.0–8.3)
Total Bilirubin: 0.4 mg/dL (ref 0.3–1.2)

## 2014-06-26 LAB — CBC WITH DIFFERENTIAL/PLATELET
BASOS ABS: 0 10*3/uL (ref 0.0–0.1)
Basophils Relative: 0 % (ref 0–1)
EOS ABS: 0.1 10*3/uL (ref 0.0–0.7)
Eosinophils Relative: 2 % (ref 0–5)
HEMATOCRIT: 41.3 % (ref 36.0–46.0)
Hemoglobin: 13.6 g/dL (ref 12.0–15.0)
Lymphocytes Relative: 17 % (ref 12–46)
Lymphs Abs: 1.1 10*3/uL (ref 0.7–4.0)
MCH: 28.2 pg (ref 26.0–34.0)
MCHC: 32.9 g/dL (ref 30.0–36.0)
MCV: 85.7 fL (ref 78.0–100.0)
Monocytes Absolute: 0.6 10*3/uL (ref 0.1–1.0)
Monocytes Relative: 9 % (ref 3–12)
NEUTROS ABS: 4.7 10*3/uL (ref 1.7–7.7)
Neutrophils Relative %: 72 % (ref 43–77)
Platelets: 293 10*3/uL (ref 150–400)
RBC: 4.82 MIL/uL (ref 3.87–5.11)
RDW: 13.8 % (ref 11.5–15.5)
WBC: 6.6 10*3/uL (ref 4.0–10.5)

## 2014-06-26 LAB — URINALYSIS, ROUTINE W REFLEX MICROSCOPIC
Bilirubin Urine: NEGATIVE
GLUCOSE, UA: NEGATIVE mg/dL
Hgb urine dipstick: NEGATIVE
Ketones, ur: NEGATIVE mg/dL
Nitrite: NEGATIVE
PH: 5 (ref 5.0–8.0)
PROTEIN: NEGATIVE mg/dL
Specific Gravity, Urine: 1.027 (ref 1.005–1.030)
Urobilinogen, UA: 0.2 mg/dL (ref 0.0–1.0)

## 2014-06-26 LAB — BRAIN NATRIURETIC PEPTIDE: B Natriuretic Peptide: 11.2 pg/mL (ref 0.0–100.0)

## 2014-06-26 LAB — URINE MICROSCOPIC-ADD ON

## 2014-06-26 LAB — TROPONIN I

## 2014-06-26 MED ORDER — METHYLPREDNISOLONE SODIUM SUCC 125 MG IJ SOLR
125.0000 mg | Freq: Once | INTRAMUSCULAR | Status: AC
  Administered 2014-06-26: 125 mg via INTRAVENOUS
  Filled 2014-06-26: qty 2

## 2014-06-26 MED ORDER — ALBUTEROL SULFATE (2.5 MG/3ML) 0.083% IN NEBU: 5.0000 mg | INHALATION_SOLUTION | Freq: Once | RESPIRATORY_TRACT | Status: DC

## 2014-06-26 MED ORDER — IPRATROPIUM BROMIDE 0.02 % IN SOLN
0.5000 mg | Freq: Once | RESPIRATORY_TRACT | Status: AC
  Administered 2014-06-26: 0.5 mg via RESPIRATORY_TRACT
  Filled 2014-06-26: qty 2.5

## 2014-06-26 MED ORDER — IPRATROPIUM-ALBUTEROL 0.5-2.5 (3) MG/3ML IN SOLN
3.0000 mL | Freq: Once | RESPIRATORY_TRACT | Status: AC
  Administered 2014-06-26: 3 mL via RESPIRATORY_TRACT
  Filled 2014-06-26: qty 3

## 2014-06-26 MED ORDER — ALBUTEROL SULFATE (2.5 MG/3ML) 0.083% IN NEBU
2.5000 mg | INHALATION_SOLUTION | Freq: Once | RESPIRATORY_TRACT | Status: AC
  Administered 2014-06-26: 2.5 mg via RESPIRATORY_TRACT
  Filled 2014-06-26: qty 3

## 2014-06-26 NOTE — H&P (Signed)
[facility] History and Physical  Martha Gillespie Ina FAO:[id number] DOB: 1940/10/02 DOA: 06/26/2014  Referring physician: Jerelyn Scott MD PCP: Egbert Garibaldi, NP   Chief Complaint: Shortness of Breath  HPI: Martha Gillespie is a 74 y.o. female presents with shortness of breath. History is from her daughter in law. She states that she has had increased SOB since last night. She has had cough with sputum. She has no hemoptysis. She has on and off chest pain noted. She states no edema noted. She does not smoke for the last 3 years. Patient has had no fevers noted. She states that she has had diarrhea also. This started last night. She states that she went to the bathroom at least 4 times. No blood noted. There has been some abdominal pain also. She states that she does take all her medications.   Review of Systems:  Constitutional:  No weight loss, night sweats, Fevers, chills, ++fatigue.  HEENT:  ++headaches, Tooth/dental problems,Sore throat,  No sneezing, itching, ear ache, nasal congestion Cardio-vascular:  No chest pain, Orthopnea, PND, swelling in lower extremities, anasarca, dizziness, palpitations  GI:  No heartburn, indigestion, ++abdominal pain, ++nausea, ++vomiting, ++diarrhea  Resp:  No shortness of breath with exertion or at rest. ++productive cough, No coughing up of blood.No change in color of mucus.No wheezing Skin:  no rash or lesions GU:  no dysuria, change in color of urine, no urgency or frequency Musculoskeletal:  No joint pain or swelling. No decreased range of motion Psych:  No change in mood or affect  Past Medical History  Diagnosis Date  . Hypertension   . Diabetes mellitus 02/15/2012   Past Surgical History  Procedure Laterality Date  . Esophagogastroduodenoscopy  02/14/2012    Procedure: ESOPHAGOGASTRODUODENOSCOPY (EGD);  Surgeon: Theda Belfast, MD;  Location: [facility] ENDOSCOPY;  Service: Endoscopy;  Laterality: N/A;   Social History:  reports  that she has been smoking.  She does not have any smokeless tobacco history on file. She reports that she does not drink alcohol or use illicit drugs.  No Known Allergies  No family history on file.   Prior to Admission medications   Medication Sig Start Date End Date Taking? Authorizing Provider  acetaminophen (TYLENOL) 500 MG tablet Take 2 tablets (1,000 mg total) by mouth every 6 (six) hours as needed. 10/08/13   Jillyn Ledger, PA-C  albuterol (PROVENTIL HFA;VENTOLIN HFA) 108 (90 BASE) MCG/ACT inhaler Inhale 2 puffs into the lungs 4 (four) times daily. 02/15/12   Tora Kindred York, PA-C  aspirin 81 MG tablet Take 81 mg by mouth daily.    Historical Provider, MD  atorvastatin (LIPITOR) 80 MG tablet Take 80 mg by mouth daily.    Historical Provider, MD  Cholecalciferol (VITAMIN D-3) 5000 UNITS TABS Take 5,000 Units by mouth daily.    Historical Provider, MD  colesevelam (WELCHOL) 625 MG tablet Take 1,875 mg by mouth 2 (two) times daily with a meal.    Historical Provider, MD  ezetimibe (ZETIA) 10 MG tablet Take 10 mg by mouth daily.    Historical Provider, MD  fenofibrate 160 MG tablet Take 160 mg by mouth daily.    Historical Provider, MD  Icosapent Ethyl (VASCEPA) 1 G CAPS Take 2 g by mouth 2 (two) times daily.    Historical Provider, MD  niacin 500 MG tablet Take 500 mg by mouth 3 (three) times daily with meals.    Historical Provider, MD  Omega-3 Fatty Acids (FISH OIL) 1200 MG CAPS  Take 2,400 mg by mouth every 8 (eight) hours.    Historical Provider, MD  SitaGLIPtin-MetFORMIN HCl (JANUMET XR) [phone] MG TB24 Take 1 tablet by mouth daily.    Historical Provider, MD   Physical Exam: Filed Vitals:   06/26/14 2230 06/26/14 2300 06/26/14 2315 06/26/14 2330  BP: 105/60 108/52 122/57 106/61  Pulse: 77 77 90 88  Temp:      TempSrc:      Resp: 18 25 17 23   SpO2: 98% 98% 90% 96%    Wt Readings from Last 3 Encounters:  02/14/12 68.2 kg (150 lb 5.7 oz)    General:  Appears calm and  comfortable Eyes: PERRL, normal lids, irises & conjunctiva ENT: grossly normal hearing, lips & tongue Neck: no LAD, masses or thyromegaly Cardiovascular: RRR, no m/r/g. No LE edema. Respiratory: CTA bilaterally, no w/r/r. Normal respiratory effort. Abdomen: soft, ntnd Skin: no rash or induration seen on limited exam Musculoskeletal: grossly normal tone BUE/BLE Psychiatric: grossly normal mood Neurologic: grossly non-focal.          Labs on Admission:  Basic Metabolic Panel:  Recent Labs Lab 06/26/14 1724  NA 135  K 3.7  CL 101  CO2 27  GLUCOSE 144*  BUN 21  CREATININE 0.69  CALCIUM 8.5   Liver Function Tests:  Recent Labs Lab 06/26/14 1724  AST 26  ALT 22  ALKPHOS 89  BILITOT 0.4  PROT 7.1  ALBUMIN 3.7   No results for input(s): LIPASE, AMYLASE in the last 168 hours. No results for input(s): AMMONIA in the last 168 hours. CBC:  Recent Labs Lab 06/26/14 1724  WBC 6.6  NEUTROABS 4.7  HGB 13.6  HCT 41.3  MCV 85.7  PLT 293   Cardiac Enzymes:  Recent Labs Lab 06/26/14 1724  TROPONINI <0.03    BNP (last 3 results)  Recent Labs  06/26/14 1724  BNP 11.2    ProBNP (last 3 results) No results for input(s): PROBNP in the last 8760 hours.  CBG: No results for input(s): GLUCAP in the last 168 hours.  Radiological Exams on Admission: Dg Chest 2 View  06/26/2014   CLINICAL DATA:  Cough, congestion, fever, nausea and vomiting for 3 days. Headache. History of COPD, bronchitis, hypertension and diabetes.  EXAM: CHEST  2 VIEW  COMPARISON:  Chest radiograph February 12 2012  FINDINGS: The cardiac silhouette is upper limits of normal in size, mediastinal silhouette is nonsuspicious. Mildly increased lung volumes with mild chronic interstitial changes, similar. RIGHT lung base linear densities. No pleural effusion or focal consolidation. No pneumothorax. Air density anterior the stomach on the lateral radiograph appears to represent extrinsic compression.  Mild osteopenia.  IMPRESSION: Borderline cardiomegaly. Mild chronic interstitial changes. RIGHT lung base atelectasis/ scarring.   Electronically Signed   By: Awilda Metroourtnay  Bloomer   On: 06/26/2014 21:02      Assessment/Plan Active Problems:   Acute respiratory failure with hypoxia   Diabetes mellitus   COPD (chronic obstructive pulmonary disease)   HTN (hypertension)   1. Acute respiratory failure with hypoxia -she is actually improved since she came into the ED -I dont see a need for steroids presently -will start on rocephin now -will start on nebulizers and dulera -oxygen as needed  2. COPD -mild exacerbation -will continue oxygen as needed -will need follow up as outpatient  3. HTN -continue with home medications -monitor pressures  4. Diabetes Mellitus -monitor FSBS -will continue with home medications -check A1C -will cover with sliding scale as needed  5. Diarrhea -possibly viral -will get stool O&P due to history of [place] travel  6. Hyperlipidemia -on multiple drugs -will get pharmacy consult to assess    Code Status: Full Code (must indicate code status--if unknown or must be presumed, indicate so) DVT Prophylaxis:Heparin Family Communication: daughter In Law (indicate person spoken with, if applicable, with phone number if by telephone) Disposition Plan: Home (indicate anticipated LOS)  Time spent:  [facility] Pager [phone]

## 2014-06-26 NOTE — ED Notes (Signed)
Spoke to Lauderdale LakesSheila in lab.  Will add Troponin and BNP.

## 2014-06-26 NOTE — ED Notes (Signed)
Spoke with pt via interpreter phone.  Pt updated on chest xray and need for urine.  Pt provided urine.    Pt asked again if she is experiencing any SOB.  Denies.  Asked if she is experiencing chest pain.  Sts "I don't know".  Asked if she is experiencing chest pressure.  Sts "I don't know".  Pt asked if she is having pain anywhere she sts "every now and then I have a twinge of pain around my navel.  But not right now".    Pt updated of reason for her visit to ED from PCP, with family at bedside.

## 2014-06-26 NOTE — ED Provider Notes (Signed)
CSN: [id number]     Arrival date & time 06/26/14  1705 History   First MD Initiated Contact with Patient 06/26/14 1952     Chief Complaint  Patient presents with  . Abdominal Pain  . Cough     (Consider location/radiation/quality/duration/timing/severity/associated sxs/prior Treatment) HPI  Hx obtained with nepali translator, even with translator history is difficult- poor historian.  Pt presenting with c/o nausea, vomiting diarrhea over the past 3 days at home.  Mild cough.  She was seen by her doctor today and referred to the ED due to having low oxygen levels.  Pt was given albuterol neb and ceftriaxone at her doctors office.  She denies cough. Has hx of COPD on prior records.  Does not use oxygen.  Denies chest pain, no lower extremity edema.   Past Medical History  Diagnosis Date  . Hypertension   . Diabetes mellitus 02/15/2012   Past Surgical History  Procedure Laterality Date  . Esophagogastroduodenoscopy  02/14/2012    Procedure: ESOPHAGOGASTRODUODENOSCOPY (EGD);  Surgeon: Theda Belfast, MD;  Location: [facility] ENDOSCOPY;  Service: Endoscopy;  Laterality: N/A;   No family history on file. History  Substance Use Topics  . Smoking status: Former Smoker    Types: Cigarettes    Quit date: 07/25/2013  . Smokeless tobacco: Not on file  . Alcohol Use: No   OB History    No data available     Review of Systems  ROS reviewed and all otherwise negative except for mentioned in HPI    Allergies  Review of patient's allergies indicates no known allergies.  Home Medications   Prior to Admission medications   Medication Sig Start Date End Date Taking? Authorizing Provider  aspirin EC 81 MG tablet Take 81 mg by mouth daily.   Yes Historical Provider, MD  atorvastatin (LIPITOR) 80 MG tablet Take 80 mg by mouth daily.   Yes Historical Provider, MD  fenofibrate 160 MG tablet Take 160 mg by mouth daily.   Yes Historical Provider, MD  Omega-3 Fatty Acids (FISH OIL) 1000 MG CAPS Take  1,000 mg by mouth 2 (two) times daily.   Yes Historical Provider, MD  SitaGLIPtin-MetFORMIN HCl (JANUMET XR) [phone] MG TB24 Take 1 tablet by mouth daily.   Yes Historical Provider, MD  Vitamin D, Ergocalciferol, (DRISDOL) 50000 UNITS CAPS capsule Take 50,000 Units by mouth every 7 (seven) days. On Fridays   Yes Historical Provider, MD  acetaminophen (TYLENOL) 500 MG tablet Take 2 tablets (1,000 mg total) by mouth every 6 (six) hours as needed. Patient taking differently: Take 1,000 mg by mouth every 6 (six) hours as needed (pain).  10/08/13   Jillyn Ledger, PA-C  albuterol (PROVENTIL HFA;VENTOLIN HFA) 108 (90 BASE) MCG/ACT inhaler Inhale 2 puffs into the lungs 4 (four) times daily. Patient not taking: Reported on 06/27/2014 02/15/12   Stephani Police, PA-C  azithromycin (ZITHROMAX Z-PAK) 250 MG tablet Take 1 tablet (250 mg total) by mouth daily. 06/28/14   Maryann Mikhail, DO  mometasone-formoterol (DULERA) 100-5 MCG/ACT AERO Inhale 2 puffs into the lungs 2 (two) times daily. 06/28/14   Maryann Mikhail, DO  Multiple Vitamin (MULTIVITAMIN WITH MINERALS) TABS tablet Take 1 tablet by mouth daily. 06/28/14   Maryann Mikhail, DO  tiotropium (SPIRIVA) 18 MCG inhalation capsule Place 1 capsule (18 mcg total) into inhaler and inhale daily. 06/28/14   Maryann Mikhail, DO   BP 98/59 mmHg  Pulse 77  Temp(Src) 98.5 F (36.9 C) (Oral)  Resp 18  SpO2 96%  Vitals reviewed Physical Exam  Physical Examination: General appearance - alert, well appearing, and in no distress Mental status - alert, oriented to person, place, and time Eyes - no conjunctival injection, no scleral icterus Mouth - mucous membranes moist, pharynx normal without lesions Chest - BSS, coarse wheezing throught, normal respitory effort.   Heart - normal rate, regular rhythm, normal S1, S2, no murmurs, rubs, clicks or gallops Abdomen - soft, nontender, nondistended, no masses or organomegaly Extremities - peripheral pulses normal, no pedal  edema, no clubbing or cyanosis Skin - normal coloration and turgor, no rashes  ED Course  Procedures (including critical care time)  11:38 PM d/w Dr. Welton Flakes for admission for COPD exacerbation.  Pt is feeling some better after nebs, continues to have oxygen requirement.  Labs Review Labs Reviewed  COMPREHENSIVE METABOLIC PANEL - Abnormal; Notable for the following:    Glucose, Bld 144 (*)    GFR calc non Af Amer 85 (*)    All other components within normal limits  URINALYSIS, ROUTINE W REFLEX MICROSCOPIC - Abnormal; Notable for the following:    Leukocytes, UA MODERATE (*)    All other components within normal limits  URINE MICROSCOPIC-ADD ON - Abnormal; Notable for the following:    Squamous Epithelial / LPF MANY (*)    All other components within normal limits  HEMOGLOBIN A1C - Abnormal; Notable for the following:    Hgb A1c MFr Bld 6.3 (*)    All other components within normal limits  COMPREHENSIVE METABOLIC PANEL - Abnormal; Notable for the following:    Glucose, Bld 196 (*)    Albumin 3.4 (*)    GFR calc non Af Amer 81 (*)    All other components within normal limits  GLUCOSE, CAPILLARY - Abnormal; Notable for the following:    Glucose-Capillary 158 (*)    All other components within normal limits  CBC WITH DIFFERENTIAL/PLATELET  TROPONIN I  BRAIN NATRIURETIC PEPTIDE  TSH  CBC  QUANTIFERON TB GOLD ASSAY (BLOOD)    Imaging Review Dg Chest 2 View  06/26/2014   CLINICAL DATA:  Cough, congestion, fever, nausea and vomiting for 3 days. Headache. History of COPD, bronchitis, hypertension and diabetes.  EXAM: CHEST  2 VIEW  COMPARISON:  Chest radiograph February 12 2012  FINDINGS: The cardiac silhouette is upper limits of normal in size, mediastinal silhouette is nonsuspicious. Mildly increased lung volumes with mild chronic interstitial changes, similar. RIGHT lung base linear densities. No pleural effusion or focal consolidation. No pneumothorax. Air density anterior the  stomach on the lateral radiograph appears to represent extrinsic compression. Mild osteopenia.  IMPRESSION: Borderline cardiomegaly. Mild chronic interstitial changes. RIGHT lung base atelectasis/ scarring.   Electronically Signed   By: Awilda Metro   On: 06/26/2014 21:02     EKG Interpretation   Date/Time:  Thursday June 26 2014 19:41:12 EST Ventricular Rate:  84 PR Interval:  168 QRS Duration: 89 QT Interval:  373 QTC Calculation: 441 R Axis:   -59 Text Interpretation:  Sinus rhythm Left anterior fascicular block Abnormal  R-wave progression, late transition No significant change since last  tracing Confirmed by [facility]  MD, MARTHA [phone]) on 06/26/2014 10:27:01 PM      MDM   Final diagnoses:  COPD exacerbation    Pt has cough, wheezing, mild hypoxia- down to 88% on RA.  Pt treated with duonebs, steroids- remainder of workup reassuring.  D/w [facility] for admission.      Ethelda Chick,  MD 06/28/14 1353

## 2014-06-26 NOTE — ED Notes (Signed)
Spoke to patient using interpreter phone - pt sts she was sent here from her doctors office because she has "sores in my lungs and in my stomach".  Pt sts she had a cough and also n/v/d for the past three days, but has not had those symptoms today.  Per her PCP paperwork pt was sent here due to "low pulse ox".  At her PCP they were unable to get her SpO2 above 92%.  Pt was on O2 when RN entered the room.  Pt removed from O2 and after 5 minutes pulse ox reads 93% RA.  Pt denies any chest pain, SOB.    Pt has her PCP visit information with her that sts she was diagnosed with "Abdominal pain, diarrhea, n/v, cough, abnormal urine, and acute bronchitis".  Pt had a chest xray done at her PCP.  She was also given 1 nebulizer treatment, 1g ceftriaxone IM, 80mg  Depo-medrol IM.

## 2014-06-26 NOTE — ED Notes (Signed)
Admitting MD at bedside.

## 2014-06-26 NOTE — ED Notes (Signed)
Pt sent here from PCP for further evaluation of cough and abdominal pain. Pt had dg chest completed which showed pneumonia. Pt c/o nvd.

## 2014-06-26 NOTE — ED Notes (Addendum)
Pt and family speak [place]

## 2014-06-27 ENCOUNTER — Encounter (HOSPITAL_COMMUNITY): Payer: Self-pay | Admitting: *Deleted

## 2014-06-27 LAB — COMPREHENSIVE METABOLIC PANEL
ALT: 20 U/L (ref 0–35)
ANION GAP: 8 (ref 5–15)
AST: 29 U/L (ref 0–37)
Albumin: 3.4 g/dL — ABNORMAL LOW (ref 3.5–5.2)
Alkaline Phosphatase: 78 U/L (ref 39–117)
BILIRUBIN TOTAL: 0.4 mg/dL (ref 0.3–1.2)
BUN: 16 mg/dL (ref 6–23)
CO2: 25 mmol/L (ref 19–32)
Calcium: 8.8 mg/dL (ref 8.4–10.5)
Chloride: 105 mmol/L (ref 96–112)
Creatinine, Ser: 0.79 mg/dL (ref 0.50–1.10)
GFR calc Af Amer: >90 mL/min (ref >90)
GFR, EST NON AFRICAN AMERICAN: 81 mL/min — AB (ref >90)
Glucose, Bld: 196 mg/dL — ABNORMAL HIGH (ref 70–99)
Potassium: 4.1 mmol/L (ref 3.5–5.1)
SODIUM: 138 mmol/L (ref 135–145)
TOTAL PROTEIN: 6.5 g/dL (ref 6.0–8.3)

## 2014-06-27 LAB — CBC
HCT: 38.9 % (ref 36.0–46.0)
HEMOGLOBIN: 12.5 g/dL (ref 12.0–15.0)
MCH: 28.2 pg (ref 26.0–34.0)
MCHC: 32.1 g/dL (ref 30.0–36.0)
MCV: 87.6 fL (ref 78.0–100.0)
PLATELETS: 260 10*3/uL (ref 150–400)
RBC: 4.44 MIL/uL (ref 3.87–5.11)
RDW: 14 % (ref 11.5–15.5)
WBC: 8.1 10*3/uL (ref 4.0–10.5)

## 2014-06-27 LAB — GLUCOSE, CAPILLARY: GLUCOSE-CAPILLARY: 158 mg/dL — AB (ref 70–99)

## 2014-06-27 LAB — TSH: TSH: 0.718 u[IU]/mL (ref 0.350–4.500)

## 2014-06-27 MED ORDER — METFORMIN HCL ER 500 MG PO TB24
1000.0000 mg | ORAL_TABLET | Freq: Every day | ORAL | Status: DC
  Administered 2014-06-27 – 2014-06-28 (×2): 1000 mg via ORAL
  Filled 2014-06-27 (×3): qty 2

## 2014-06-27 MED ORDER — COLESEVELAM HCL 625 MG PO TABS
1875.0000 mg | ORAL_TABLET | Freq: Two times a day (BID) | ORAL | Status: DC
  Administered 2014-06-27 – 2014-06-28 (×3): 1875 mg via ORAL
  Filled 2014-06-27 (×5): qty 3

## 2014-06-27 MED ORDER — DEXTROSE 5 % IV SOLN
500.0000 mg | INTRAVENOUS | Status: DC
  Administered 2014-06-27: 500 mg via INTRAVENOUS
  Filled 2014-06-27 (×2): qty 500

## 2014-06-27 MED ORDER — FOLIC ACID 1 MG PO TABS
1.0000 mg | ORAL_TABLET | Freq: Every day | ORAL | Status: DC
  Administered 2014-06-27 – 2014-06-28 (×2): 1 mg via ORAL
  Filled 2014-06-27 (×2): qty 1

## 2014-06-27 MED ORDER — HEPARIN SODIUM (PORCINE) 5000 UNIT/ML IJ SOLN
5000.0000 [IU] | Freq: Three times a day (TID) | INTRAMUSCULAR | Status: DC
  Administered 2014-06-27 – 2014-06-28 (×4): 5000 [IU] via SUBCUTANEOUS
  Filled 2014-06-27 (×7): qty 1

## 2014-06-27 MED ORDER — ASPIRIN EC 81 MG PO TBEC
81.0000 mg | DELAYED_RELEASE_TABLET | Freq: Every day | ORAL | Status: DC
  Administered 2014-06-27 – 2014-06-28 (×2): 81 mg via ORAL
  Filled 2014-06-27 (×2): qty 1

## 2014-06-27 MED ORDER — EZETIMIBE 10 MG PO TABS
10.0000 mg | ORAL_TABLET | Freq: Every day | ORAL | Status: DC
  Administered 2014-06-27 – 2014-06-28 (×2): 10 mg via ORAL
  Filled 2014-06-27 (×2): qty 1

## 2014-06-27 MED ORDER — SODIUM CHLORIDE 0.9 % IV SOLN
INTRAVENOUS | Status: DC
  Administered 2014-06-27 – 2014-06-28 (×2): via INTRAVENOUS

## 2014-06-27 MED ORDER — ACETAMINOPHEN 650 MG RE SUPP: 650.0000 mg | Freq: Four times a day (QID) | RECTAL | Status: DC | PRN

## 2014-06-27 MED ORDER — NIACIN 500 MG PO TABS
500.0000 mg | ORAL_TABLET | Freq: Three times a day (TID) | ORAL | Status: DC
  Administered 2014-06-27 – 2014-06-28 (×5): 500 mg via ORAL
  Filled 2014-06-27 (×7): qty 1

## 2014-06-27 MED ORDER — LINAGLIPTIN 5 MG PO TABS
5.0000 mg | ORAL_TABLET | Freq: Every day | ORAL | Status: DC
  Administered 2014-06-27 – 2014-06-28 (×2): 5 mg via ORAL
  Filled 2014-06-27 (×3): qty 1

## 2014-06-27 MED ORDER — SODIUM CHLORIDE 0.9 % IJ SOLN
3.0000 mL | Freq: Two times a day (BID) | INTRAMUSCULAR | Status: DC
  Administered 2014-06-27: 3 mL via INTRAVENOUS

## 2014-06-27 MED ORDER — ADULT MULTIVITAMIN W/MINERALS CH
1.0000 | ORAL_TABLET | Freq: Every day | ORAL | Status: DC
  Administered 2014-06-27 – 2014-06-28 (×2): 1 via ORAL
  Filled 2014-06-27 (×2): qty 1

## 2014-06-27 MED ORDER — ICOSAPENT ETHYL 1 G PO CAPS: 2.0000 g | ORAL_CAPSULE | Freq: Two times a day (BID) | ORAL | Status: DC

## 2014-06-27 MED ORDER — FENOFIBRATE 160 MG PO TABS
160.0000 mg | ORAL_TABLET | Freq: Every day | ORAL | Status: DC
  Administered 2014-06-27 – 2014-06-28 (×2): 160 mg via ORAL
  Filled 2014-06-27 (×2): qty 1

## 2014-06-27 MED ORDER — TIOTROPIUM BROMIDE MONOHYDRATE 18 MCG IN CAPS
18.0000 ug | ORAL_CAPSULE | Freq: Every day | RESPIRATORY_TRACT | Status: DC
  Administered 2014-06-28: 18 ug via RESPIRATORY_TRACT
  Filled 2014-06-27 (×2): qty 5

## 2014-06-27 MED ORDER — ONDANSETRON HCL 4 MG/2ML IJ SOLN: 4.0000 mg | Freq: Four times a day (QID) | INTRAMUSCULAR | Status: DC | PRN

## 2014-06-27 MED ORDER — ATORVASTATIN CALCIUM 80 MG PO TABS
80.0000 mg | ORAL_TABLET | Freq: Every day | ORAL | Status: DC
  Administered 2014-06-27 – 2014-06-28 (×2): 80 mg via ORAL
  Filled 2014-06-27 (×2): qty 1

## 2014-06-27 MED ORDER — CEFTRIAXONE SODIUM IN DEXTROSE 20 MG/ML IV SOLN
1.0000 g | INTRAVENOUS | Status: DC
Start: 2014-06-28 — End: 2014-06-28
  Administered 2014-06-28: 1 g via INTRAVENOUS
  Filled 2014-06-27: qty 50

## 2014-06-27 MED ORDER — ONDANSETRON HCL 4 MG PO TABS: 4.0000 mg | ORAL_TABLET | Freq: Four times a day (QID) | ORAL | Status: DC | PRN

## 2014-06-27 MED ORDER — VITAMIN D 1000 UNITS PO TABS
5000.0000 [IU] | ORAL_TABLET | Freq: Every day | ORAL | Status: DC
  Administered 2014-06-27 – 2014-06-28 (×2): 5000 [IU] via ORAL
  Filled 2014-06-27 (×4): qty 5

## 2014-06-27 MED ORDER — IPRATROPIUM-ALBUTEROL 0.5-2.5 (3) MG/3ML IN SOLN: 3.0000 mL | RESPIRATORY_TRACT | Status: DC | PRN

## 2014-06-27 MED ORDER — MOMETASONE FURO-FORMOTEROL FUM 100-5 MCG/ACT IN AERO
2.0000 | INHALATION_SPRAY | Freq: Two times a day (BID) | RESPIRATORY_TRACT | Status: DC
  Administered 2014-06-28: 2 via RESPIRATORY_TRACT
  Filled 2014-06-27 (×3): qty 8.8

## 2014-06-27 MED ORDER — CEFTRIAXONE SODIUM IN DEXTROSE 20 MG/ML IV SOLN
1.0000 g | Freq: Once | INTRAVENOUS | Status: AC
  Administered 2014-06-27: 1 g via INTRAVENOUS
  Filled 2014-06-27: qty 50

## 2014-06-27 MED ORDER — VITAMIN B-1 100 MG PO TABS
100.0000 mg | ORAL_TABLET | Freq: Every day | ORAL | Status: DC
  Administered 2014-06-27 – 2014-06-28 (×2): 100 mg via ORAL
  Filled 2014-06-27 (×2): qty 1

## 2014-06-27 MED ORDER — ACETAMINOPHEN 325 MG PO TABS: 650.0000 mg | ORAL_TABLET | Freq: Four times a day (QID) | ORAL | Status: DC | PRN

## 2014-06-27 MED ORDER — SITAGLIP PHOS-METFORMIN HCL ER 100-1000 MG PO TB24: 1.0000 | ORAL_TABLET | Freq: Every day | ORAL | Status: DC

## 2014-06-27 NOTE — Progress Notes (Signed)
[facility]                                                                              Patient Demographics  Martha Gillespie, is a 74 y.o. female, DOB - 10/09/1940, OZD:[id number]  Admit date - 06/26/2014   Admitting Physician Yevonne PaxSaadat A Khan, MD  Outpatient Primary MD for the patient is Millsaps, Joelene MillinKIMBERLY M, NP  LOS - 1   Chief Complaint  Patient presents with  . Abdominal Pain  . Cough      HPI on 06/26/2014 by Dr. Freda MunroSaadat Khan Cheyan Colin Martha Gillespie is a 74 y.o. female presents with shortness of breath. History is from her daughter in law. She states that she has had increased SOB since last night. She has had cough with sputum. She has no hemoptysis. She has on and off chest pain noted. She states no edema noted. She does not smoke for the last 3 years. Patient has had no fevers noted. She states that she has had diarrhea also. This started last night. She states that she went to the bathroom at least 4 times. No blood noted. There has been some abdominal pain also. She states that she does take all her medications.  Assessment & Plan   Acute respiratory failure with hypoxia -Patient improved in the emergency department for admission -Chest x-ray showed borderline cardiomegaly, mild chronic interstitial changes, right lung base atelectasis/scarring -Patient is currently not wheezing, therefore will not provide steroids -Continue azithromycin and ceftriaxone, nebulizers and Duleram, Spiriva  -Continue supplemental oxygen to maintain saturations above 92%  Mild COPD exacerbation -Patient currently is not wheezing, will continue oxygen and treatments as above  Hypertension -Stable, on no home medications  Diabetes mellitus, type II -Stable, continue home medications, metformin, Tradjenta  Diarrhea -Resolved, likely viral gastroenteritis  Hyperlipidemia -Continue WelChol, Zetia, Lipitor, niacin  Code Status: Full  Family Communication: Family at bedside  Disposition Plan:  Admitted, expect discharge within the next 24 hours  Time Spent in minutes   30 minutes  Procedures  None  Consults   None  DVT Prophylaxis  heparin  Lab Results  Component Value Date   PLT 260 06/27/2014    Medications  Scheduled Meds: . aspirin EC  81 mg Oral Daily  . atorvastatin  80 mg Oral Daily  . [START ON 06/28/2014] cefTRIAXone (ROCEPHIN)  IV  1 g Intravenous Q24H  . cholecalciferol  5,000 Units Oral Daily  . colesevelam  1,875 mg Oral BID WC  . ezetimibe  10 mg Oral Daily  . fenofibrate  160 mg Oral Daily  . folic acid  1 mg Oral Daily  . heparin  5,000 Units Subcutaneous 3 times per day  . Icosapent Ethyl  2 g Oral BID  . linagliptin  5 mg Oral Q breakfast   And  . metFORMIN  1,000 mg Oral Q breakfast  . mometasone-formoterol  2 puff Inhalation BID  . multivitamin with minerals  1 tablet Oral Daily  . niacin  500 mg Oral TID WC  . sodium chloride  3 mL Intravenous Q12H  . thiamine  100 mg Oral Daily  . tiotropium  18 mcg Inhalation Daily   Continuous  Infusions: . sodium chloride 50 mL/hr at 06/27/14 0151   PRN Meds:.acetaminophen **OR** acetaminophen, ipratropium-albuterol, ondansetron **OR** ondansetron (ZOFRAN) IV  Antibiotics    Anti-infectives    Start     Dose/Rate Route Frequency Ordered Stop   06/28/14 0000  cefTRIAXone (ROCEPHIN) 1 g in dextrose 5 % 50 mL IVPB - Premix     1 g 100 mL/hr over 30 Minutes Intravenous Every 24 hours 06/27/14 0119     06/27/14 0130  cefTRIAXone (ROCEPHIN) 1 g in dextrose 5 % 50 mL IVPB - Premix     1 g 100 mL/hr over 30 Minutes Intravenous  Once 06/27/14 0119 06/27/14 0310        Subjective:   Amreen Symmonds seen and examined today.  Patient's daughter-in-law at bedside used as interpreter. Patient states her shortness of breath has improved and her diarrhea has resolved. Denies any chest pain abdominal pain, dizziness, headache at this time.    Objective:   Filed Vitals:   06/26/14 2330 06/27/14 0015  06/27/14 0600 06/27/14 1325  BP: 106/61 109/67 101/49 105/57  Pulse: 88 88 88 80  Temp:   98.2 F (36.8 C) 98.5 F (36.9 C)  TempSrc:   Oral Oral  Resp: SpO2: 96% 93% 95% 97%    Wt Readings from Last 3 Encounters:  02/14/12 68.2 kg (150 lb 5.7 oz)     Intake/Output Summary (Last 24 hours) at 06/27/14 1342 Last data filed at 06/27/14 1230  Gross per 24 hour  Intake    360 ml  Output      0 ml  Net    360 ml    Exam  General: Well developed, well nourished, NAD, appears stated age  HEENT: NCAT, mucous membranes moist.   Cardiovascular: S1 S2 auscultated, no rubs, murmurs or gallops. Regular rate and rhythm.  Respiratory: Clear to auscultation bilaterally with equal chest rise, no wheezing noted  Abdomen: Soft, nontender, nondistended, + bowel sounds  Extremities: warm dry without cyanosis clubbing or edema  Neuro: AAOx3, nonfocal  Skin: Without rashes exudates or nodules  Psych: Appropriate  Data Review   Micro Results No results found for this or any previous visit (from the past 240 hour(s)).  Radiology Reports Dg Chest 2 View  06/26/2014   CLINICAL DATA:  Cough, congestion, fever, nausea and vomiting for 3 days. Headache. History of COPD, bronchitis, hypertension and diabetes.  EXAM: CHEST  2 VIEW  COMPARISON:  Chest radiograph February 12 2012  FINDINGS: The cardiac silhouette is upper limits of normal in size, mediastinal silhouette is nonsuspicious. Mildly increased lung volumes with mild chronic interstitial changes, similar. RIGHT lung base linear densities. No pleural effusion or focal consolidation. No pneumothorax. Air density anterior the stomach on the lateral radiograph appears to represent extrinsic compression. Mild osteopenia.  IMPRESSION: Borderline cardiomegaly. Mild chronic interstitial changes. RIGHT lung base atelectasis/ scarring.   Electronically Signed   By: Awilda Metro   On: 06/26/2014 21:02    CBC  Recent Labs Lab  06/26/14 1724 06/27/14 0400  WBC 6.6 8.1  HGB 13.6 12.5  HCT 41.3 38.9  PLT 293 260  MCV 85.7 87.6  MCH 28.2 28.2  MCHC 32.9 32.1  RDW 13.8 14.0  LYMPHSABS 1.1  --   MONOABS 0.6  --   EOSABS 0.1  --   BASOSABS 0.0  --     Chemistries   Recent Labs Lab 06/26/14 1724 06/27/14 0400  NA 135 138  K 3.7 4.1  CL 101 105  CO2 27 25  GLUCOSE 144* 196*  BUN 21 16  CREATININE 0.69 0.79  CALCIUM 8.5 8.8  AST 26 29  ALT 22 20  ALKPHOS 89 78  BILITOT 0.4 0.4   ------------------------------------------------------------------------------------------------------------------ CrCl cannot be calculated (Unknown ideal weight.). ------------------------------------------------------------------------------------------------------------------ No results for input(s): HGBA1C in the last 72 hours. ------------------------------------------------------------------------------------------------------------------ No results for input(s): CHOL, HDL, LDLCALC, TRIG, CHOLHDL, LDLDIRECT in the last 72 hours. ------------------------------------------------------------------------------------------------------------------  Recent Labs  06/27/14 0400  TSH 0.718   ------------------------------------------------------------------------------------------------------------------ No results for input(s): VITAMINB12, FOLATE, FERRITIN, TIBC, IRON, RETICCTPCT in the last 72 hours.  Coagulation profile No results for input(s): INR, PROTIME in the last 168 hours.  No results for input(s): DDIMER in the last 72 hours.  Cardiac Enzymes  Recent Labs Lab 06/26/14 1724  TROPONINI <0.03   ------------------------------------------------------------------------------------------------------------------ Invalid input(s): POCBNP    Maritssa Haughton D.O. on 06/27/2014 at 1:42 PM  Between 7am to 7pm - Pager - [phone]  After 7pm go to [email or web address] - password [email or web address]  And look for the night  coverage person covering for me after hours  [facility] Office  [phone]

## 2014-06-27 NOTE — ED Notes (Signed)
Family confirmed patient updated with POC and no questions at this time.

## 2014-06-27 NOTE — Treatment Plan (Signed)
Patient received to room 227 via stretcher from ED.  Pt only speaks [place] so most of history obtained from daughter in law at bedside.  Pt currently denies pain, or SOB.  Will continue to monitor pt and assist as needed for meeting her needs.

## 2014-06-27 NOTE — Progress Notes (Signed)
74yo female c/o SOB, admitted for acute respiratory failure with hypoxia and COPD, to begin IV ABX.  Will start Rocephin 1g IV Q24H and monitor CBC and Cx.  Vernard GamblesVeronda Kylah Maresh, PharmD, BCPS 06/27/2014 1:18 AM

## 2014-06-27 NOTE — Progress Notes (Signed)
UR Completed.  [phone]  

## 2014-06-28 LAB — HEMOGLOBIN A1C
Hgb A1c MFr Bld: 6.3 % — ABNORMAL HIGH (ref 4.8–5.6)
Mean Plasma Glucose: 134 mg/dL

## 2014-06-28 MED ORDER — MOMETASONE FURO-FORMOTEROL FUM 100-5 MCG/ACT IN AERO: 2.0000 | INHALATION_SPRAY | Freq: Two times a day (BID) | RESPIRATORY_TRACT | Status: DC

## 2014-06-28 MED ORDER — ADULT MULTIVITAMIN W/MINERALS CH: 1.0000 | ORAL_TABLET | Freq: Every day | ORAL | Status: DC

## 2014-06-28 MED ORDER — AZITHROMYCIN 250 MG PO TABS: 250.0000 mg | ORAL_TABLET | Freq: Every day | ORAL | Status: DC

## 2014-06-28 MED ORDER — TIOTROPIUM BROMIDE MONOHYDRATE 18 MCG IN CAPS: 18.0000 ug | ORAL_CAPSULE | Freq: Every day | RESPIRATORY_TRACT | Status: DC

## 2014-06-28 NOTE — Progress Notes (Signed)
Patient discharged to home with her son. Discharge instructions given to patient's daughter. Pres[facility]. Patient's daughter Priscille Heidelbergmdika Karki, verbalized understanding of instructions given. Afleming, RN

## 2014-06-28 NOTE — Progress Notes (Signed)
RT note- English barrier, performed inhalers fairly well.

## 2014-06-28 NOTE — Discharge Summary (Signed)
Physician Discharge Summary  Martha Gillespie ZHY:[id number] DOB: 05/07/1941 DOA: 06/26/2014  PCP: Egbert GaribaldiMillsaps, KIMBERLY M, NP  Admit date: 06/26/2014 Discharge date: 06/28/2014  Time spent: 45 minutes  Recommendations for Outpatient Follow-up:  Patient will be discharged to home.  She will need to followup with her primary care physician within one week of discharge. Patient should discuss her cholesterol and her medications with her primary care physician. Patient to continue her medications as prescribed. Patient should resume a heart healthy/carb modified diet. Patient may resume activity as tolerated.  Discharge Diagnoses:  Acute respiratory failure with hypoxia Mild COPD exacerbation Hypertension  Diabetes, type II Diarrhea Hyperlipidemia  Discharge Condition: Stable  Diet recommendation: heart healthy/carb modified  There were no vitals filed for this visit.  History of present illness:  on 06/26/2014 by Dr. Freda MunroSaadat Khan Martha Gillespie is a 74 y.o. female presents with shortness of breath. History is from her daughter in law. She states that she has had increased SOB since last night. She has had cough with sputum. She has no hemoptysis. She has on and off chest pain noted. She states no edema noted. She does not smoke for the last 3 years. Patient has had no fevers noted. She states that she has had diarrhea also. This started last night. She states that she went to the bathroom at least 4 times. No blood noted. There has been some abdominal pain also. She states that she does take all her medications.  Hospital Course:  Acute respiratory failure with hypoxia -Patient improved in the emergency department for admission -Chest x-ray showed borderline cardiomegaly, mild chronic interstitial changes, right lung base atelectasis/scarring -Patient is currently not wheezing, therefore will not provide steroids -Continue azithromycin and ceftriaxone, nebulizers and Duleram, Spiriva   -Continue supplemental oxygen to maintain saturations above 92%  Mild COPD exacerbation -Patient currently is not wheezing   Hypertension -Stable, on no home medications  Diabetes mellitus, type II -Stable, continue home medications, metformin, Tradjenta  Diarrhea -Resolved, likely viral gastroenteritis  Hyperlipidemia -Continue WelChol, Zetia, Lipitor, niacin- -Not sure patient is taking all of these medications, she has not had them filled in over 6 months -She should followup with her primary care physician  Procedures: None  Consultations: None  Discharge Exam: Filed Vitals:   06/28/14 0524  BP: 98/59  Pulse: 77  Temp: 98.5 F (36.9 C)  Resp: 18   Exam  General: Well developed, well nourished, NAD, appears stated age  HEENT: NCAT, mucous membranes moist.   Cardiovascular: S1 S2 auscultated, no rubs, murmurs or gallops. Regular rate and rhythm.  Respiratory: Clear to auscultation bilaterally with equal chest rise, no wheezing noted  Abdomen: Soft, nontender, nondistended, + bowel sounds  Extremities: warm dry without cyanosis clubbing or edema  Discharge Instructions      Discharge Instructions    Discharge instructions    Complete by:  As directed   Patient will be discharged to home.  She will need to followup with her primary care physician within one week of discharge. Patient should discuss her cholesterol and her medications with her primary care physician. Patient to continue her medications as prescribed. Patient should resume a heart healthy/carb modified diet. Patient may resume activity as tolerated.            Medication List    TAKE these medications        acetaminophen 500 MG tablet  Commonly known as:  TYLENOL  Take 2 tablets (1,000 mg total) by mouth  every 6 (six) hours as needed.     albuterol 108 (90 BASE) MCG/ACT inhaler  Commonly known as:  PROVENTIL HFA;VENTOLIN HFA  Inhale 2 puffs into the lungs 4 (four) times daily.      aspirin EC 81 MG tablet  Take 81 mg by mouth daily.     atorvastatin 80 MG tablet  Commonly known as:  LIPITOR  Take 80 mg by mouth daily.     azithromycin 250 MG tablet  Commonly known as:  ZITHROMAX Z-PAK  Take 1 tablet (250 mg total) by mouth daily.     fenofibrate 160 MG tablet  Take 160 mg by mouth daily.     Fish Oil 1000 MG Caps  Take 1,000 mg by mouth 2 (two) times daily.     JANUMET XR [phone] MG Tb24  Generic drug:  SitaGLIPtin-MetFORMIN HCl  Take 1 tablet by mouth daily.     mometasone-formoterol 100-5 MCG/ACT Aero  Commonly known as:  DULERA  Inhale 2 puffs into the lungs 2 (two) times daily.     multivitamin with minerals Tabs tablet  Take 1 tablet by mouth daily.     tiotropium 18 MCG inhalation capsule  Commonly known as:  SPIRIVA  Place 1 capsule (18 mcg total) into inhaler and inhale daily.     Vitamin D (Ergocalciferol) 50000 UNITS Caps capsule  Commonly known as:  DRISDOL  Take 50,000 Units by mouth every 7 (seven) days. On Fridays       No Known Allergies Follow-up Information    Follow up with Millsaps, Joelene Millin, NP. Schedule an appointment as soon as possible for a visit in 1 week.   Why:  Hospital followup   Contact information:   [facility] [address] [place] [address] [phone]        The results of significant diagnostics from this hospitalization (including imaging, microbiology, ancillary and laboratory) are listed below for reference.    Significant Diagnostic Studies: Dg Chest 2 View  06/26/2014   CLINICAL DATA:  Cough, congestion, fever, nausea and vomiting for 3 days. Headache. History of COPD, bronchitis, hypertension and diabetes.  EXAM: CHEST  2 VIEW  COMPARISON:  Chest radiograph February 12 2012  FINDINGS: The cardiac silhouette is upper limits of normal in size, mediastinal silhouette is nonsuspicious. Mildly increased lung volumes with mild chronic interstitial changes, similar.  RIGHT lung base linear densities. No pleural effusion or focal consolidation. No pneumothorax. Air density anterior the stomach on the lateral radiograph appears to represent extrinsic compression. Mild osteopenia.  IMPRESSION: Borderline cardiomegaly. Mild chronic interstitial changes. RIGHT lung base atelectasis/ scarring.   Electronically Signed   By: Awilda Metro   On: 06/26/2014 21:02    Microbiology: No results found for this or any previous visit (from the past 240 hour(s)).   Labs: Basic Metabolic Panel:  Recent Labs Lab 06/26/14 1724 06/27/14 0400  NA 135 138  K 3.7 4.1  CL 101 105  CO2 27 25  GLUCOSE 144* 196*  BUN 21 16  CREATININE 0.69 0.79  CALCIUM 8.5 8.8   Liver Function Tests:  Recent Labs Lab 06/26/14 1724 06/27/14 0400  AST 26 29  ALT 22 20  ALKPHOS 89 78  BILITOT 0.4 0.4  PROT 7.1 6.5  ALBUMIN 3.7 3.4*   No results for input(s): LIPASE, AMYLASE in the last 168 hours. No results for input(s): AMMONIA in the last 168 hours. CBC:  Recent Labs Lab 06/26/14 1724 06/27/14 0400  WBC 6.6 8.1  NEUTROABS 4.7  --   HGB 13.6 12.5  HCT 41.3 38.9  MCV 85.7 87.6  PLT 293 260   Cardiac Enzymes:  Recent Labs Lab 06/26/14 1724  TROPONINI <0.03   BNP: BNP (last 3 results)  Recent Labs  06/26/14 1724  BNP 11.2    ProBNP (last 3 results) No results for input(s): PROBNP in the last 8760 hours.  CBG:  Recent Labs Lab 06/27/14 0942  GLUCAP 158*       Signed:  Edsel Petrin  [facility] 06/28/2014, 10:13 AM

## 2014-06-28 NOTE — Discharge Instructions (Signed)

## 2014-06-30 LAB — QUANTIFERON TB GOLD ASSAY (BLOOD)

## 2014-06-30 LAB — QUANTIFERON IN TUBE
QFT TB AG MINUS NIL VALUE: 0 IU/mL
QUANTIFERON MITOGEN VALUE: 0.34 IU/mL
QUANTIFERON NIL VALUE: 0.02 [IU]/mL
QUANTIFERON TB AG VALUE: 0.02 [IU]/mL
QUANTIFERON TB GOLD: UNDETERMINED

## 2016-06-20 ENCOUNTER — Ambulatory Visit (INDEPENDENT_AMBULATORY_CARE_PROVIDER_SITE_OTHER): Payer: Medicaid Other | Admitting: Student

## 2016-06-20 ENCOUNTER — Encounter: Payer: Self-pay | Admitting: Student

## 2016-06-20 VITALS — BP 134/82 | HR 96 | Temp 98.3°F | Ht 63.0 in | Wt 144.0 lb

## 2016-06-20 DIAGNOSIS — H5713 Ocular pain, bilateral: Secondary | ICD-10-CM | POA: Insufficient documentation

## 2016-06-20 DIAGNOSIS — E119 Type 2 diabetes mellitus without complications: Secondary | ICD-10-CM

## 2016-06-20 DIAGNOSIS — Z0000 Encounter for general adult medical examination without abnormal findings: Secondary | ICD-10-CM | POA: Insufficient documentation

## 2016-06-20 DIAGNOSIS — E1142 Type 2 diabetes mellitus with diabetic polyneuropathy: Secondary | ICD-10-CM

## 2016-06-20 DIAGNOSIS — Z23 Encounter for immunization: Secondary | ICD-10-CM | POA: Diagnosis not present

## 2016-06-20 DIAGNOSIS — I10 Essential (primary) hypertension: Secondary | ICD-10-CM

## 2016-06-20 DIAGNOSIS — E785 Hyperlipidemia, unspecified: Secondary | ICD-10-CM

## 2016-06-20 DIAGNOSIS — M792 Neuralgia and neuritis, unspecified: Secondary | ICD-10-CM | POA: Insufficient documentation

## 2016-06-20 HISTORY — DX: Ocular pain, bilateral: H57.13

## 2016-06-20 LAB — BASIC METABOLIC PANEL WITH GFR
BUN: 17 mg/dL (ref 7–25)
CO2: 29 mmol/L (ref 20–31)
Calcium: 9.7 mg/dL (ref 8.6–10.4)
Chloride: 101 mmol/L (ref 98–110)
Creat: 0.74 mg/dL (ref 0.60–0.93)
GFR, Est African American: >89 mL/min (ref >=60)
GFR, Est Non African American: 80 mL/min (ref >=60)
Glucose, Bld: 228 mg/dL — ABNORMAL HIGH (ref 65–99)
Potassium: 4.7 mmol/L (ref 3.5–5.3)
Sodium: 136 mmol/L (ref 135–146)

## 2016-06-20 LAB — TSH: TSH: 2.47 m[IU]/L

## 2016-06-20 LAB — POCT GLYCOSYLATED HEMOGLOBIN (HGB A1C): Hemoglobin A1C: 7.2

## 2016-06-20 LAB — LIPID PANEL
Cholesterol: 295 mg/dL — ABNORMAL HIGH (ref <200)
HDL: 31 mg/dL — ABNORMAL LOW (ref >50)
Total CHOL/HDL Ratio: 9.5 Ratio — ABNORMAL HIGH (ref <5.0)
Triglycerides: 635 mg/dL — ABNORMAL HIGH (ref <150)

## 2016-06-20 MED ORDER — METFORMIN HCL 500 MG PO TABS: 500.0000 mg | ORAL_TABLET | Freq: Every day | ORAL | 0 refills | Status: DC

## 2016-06-20 MED ORDER — ATORVASTATIN CALCIUM 40 MG PO TABS: 40.0000 mg | ORAL_TABLET | Freq: Every day | ORAL | 1 refills | Status: DC

## 2016-06-20 MED ORDER — GABAPENTIN 100 MG PO CAPS: 100.0000 mg | ORAL_CAPSULE | Freq: Every day | ORAL | 0 refills | Status: DC

## 2016-06-20 MED ORDER — ASPIRIN EC 81 MG PO TBEC: 81.0000 mg | DELAYED_RELEASE_TABLET | Freq: Every day | ORAL | 11 refills | Status: DC

## 2016-06-20 MED ORDER — CALCIUM CARBONATE-VITAMIN D3 600-400 MG-UNIT PO TABS: ORAL_TABLET | ORAL | 3 refills | Status: DC

## 2016-06-20 NOTE — Assessment & Plan Note (Signed)
Normotensive today. -We will consider Ace inhibitors/ARB for renal protection after BMP

## 2016-06-20 NOTE — Progress Notes (Signed)
Subjective:    Martha Gillespie is a 76 y.o. old female here with daughter-in-law to establish care. She also reports concern about her vision and hearing.  Video interpreter with ID number [id number] was used for this encounter.   HPI Poor vision:  for three to four months. Eye problem started spontaneously. Denies trauma or accident. Denies prior eye procedure. She hasn't been to eye doctor. Reports eye pain, left > right for two to three months. No recent change. Less pain today. She says she nan not see anything with left eye. Right eye is not good either Reports bilateral ear pain and diminished hearing for the last one year.   Was using an eye drop that helped a little bit. She doesn't remember the name of the medicine. She is not using it now.   Currently, she is not taking any medicine. She used to take medicine for DM, hyperlipidemia and hypertension.   [facility]. Last visit was 7 months ago. They were told that she doesn't need to be on any medicine.   Diabetes: Currently not on any medication. Reports neuropathic pain in her legs. He also reports poor vision recently over the last 3-4 months. Denies polydipsia or polyuria.  Hypertension: Not on any medication for the last 7 months.   PMH/Problem List: has Tobacco abuse; Antral ulcer; Diabetes mellitus (HCC); COPD (chronic obstructive pulmonary disease) (HCC); HTN (hypertension); Eye pain, bilateral; Hyperlipidemia; Neuropathic pain; and Routine adult health maintenance on her problem list.   has a past medical history of Diabetes mellitus (HCC) (02/15/2012) and Hypertension.  FH:  No family history on file.  SH Social History  Substance Use Topics  . Smoking status: Former Smoker    Types: Cigarettes    Quit date: 07/25/2013  . Smokeless tobacco: Never Used  . Alcohol use No    Review of Systems  Constitutional: Negative for appetite change, fever and unexpected weight change.  HENT: Negative for  trouble swallowing and voice change.   Eyes: Positive for pain and visual disturbance.  Respiratory: Positive for cough. Negative for shortness of breath.        Little whitish phlegm. No hemoptysis  Cardiovascular: Negative for chest pain.  Gastrointestinal: Negative for blood in stool, constipation, diarrhea and nausea.  Endocrine: Negative for cold intolerance, heat intolerance, polyphagia and polyuria.  Genitourinary: Negative for dysuria and hematuria.  Musculoskeletal: Negative for arthralgias.  Skin: Negative for rash.  Neurological: Positive for numbness. Negative for weakness.       Neuropathic pain for long time  Hematological: Negative for adenopathy. Does not bruise/bleed easily.  Psychiatric/Behavioral: Negative for dysphoric mood. The patient is not nervous/anxious.    Review of systems negative except for pertinent positives and negatives in history of present illness above.     Objective:     Vitals:   06/20/16 1343  BP: 134/82  Pulse: 96  Temp: 98.3 F (36.8 C)  TempSrc: Oral  SpO2: 96%  Weight: 144 lb (65.3 kg)  Height: 5\' 3"  (1.6 m)    Physical Exam GEN: appears well, no apparent distress. Head: normocephalic and atraumatic  Eyes: conjunctiva without injection, sclera anicteric, limited fundoscopic exam Ears: external ear, ear canal normal and TM normal.  Oropharynx: mmm without erythema or exudation HEM: negative for cervical or periauricular lymphadenopathies CVS: RRR, nl S1&S2, no murmurs, no edema, cap refills < 2 secs RESP: speaks in full sentence, no IWOB, CTAB GI: BS present & normal, soft, NTND,  no guarding, no rebound, no mass MSK: no focal tenderness or notable swelling SKIN: no apparent skin lesion NEURO: alert and oiented appropriately, no gross defecits  PSYCH: euthymic mood with congruent affect    Assessment and Plan:  Eye pain, bilateral Left > right for the last 2-3 months. Also with poor vision for the last 3-4 months Unclear  etiology. Could be glaucoma. Pain argues against retinopathy. -Order referral to ophthalmology.   Diabetes mellitus A1c 7.2. She is not on any medication. She endorses neuropathy and poor vision, although the later could be due to other reasons such as glaucoma.  -Metformin 500 mg daily. We will titrate the dose up based on tolerance -Referral to ophthalmologist -Urine microalbuminuria -We will discuss about renal protection when she returns -Discussed about lifestyle changes including diet and exercise as in AVS  Hyperlipidemia -Lipitor 40 mg daily. We'll adjust dose based on ASCVD risk score after lipid panel -Lipid panel today -Aspirin 81 mg daily   Neuropathic pain Will initiate gabapentin at 100 mg daily at bedtime. We will titrate up as needed based on tolerance  HTN (hypertension) Normotensive today. -We will consider Ace inhibitors/ARB for renal protection after BMP  Routine adult health maintenance Sent prescription for Vitamin D/calcium to her pharmacy  Orders Placed This Encounter  Procedures  . BASIC METABOLIC PANEL WITH GFR  . Lipid panel  . TSH  . Microalbumin/Creatinine Ratio, Urine  . Ambulatory referral to Ophthalmology    Referral Priority:   Routine    Referral Type:   Consultation    Referral Reason:   Specialty Services Required    Requested Specialty:   Ophthalmology    Number of Visits Requested:   1  . HgB A1c    Return in about 2 weeks (around 07/04/2016) for Annual physical.  Almon Hercules, MD 06/20/16 Pager: [phone]

## 2016-06-20 NOTE — Assessment & Plan Note (Signed)
Will initiate gabapentin at 100 mg daily at bedtime. We will titrate up as needed based on tolerance

## 2016-06-20 NOTE — Assessment & Plan Note (Signed)
Left > right for the last 2-3 months. Also with poor vision for the last 3-4 months Unclear etiology. Could be glaucoma. Pain argues against retinopathy. -Order referral to ophthalmology.

## 2016-06-20 NOTE — Assessment & Plan Note (Signed)
Sent prescription for Vitamin D/calcium to her pharmacy

## 2016-06-20 NOTE — Assessment & Plan Note (Addendum)
A1c 7.2. She is not on any medication. She endorses neuropathy and poor vision, although the later could be due to other reasons such as glaucoma.  -Metformin 500 mg daily. We will titrate the dose up based on tolerance -Referral to ophthalmologist -Urine microalbuminuria -We will discuss about renal protection when she returns -Discussed about lifestyle changes including diet and exercise as in AVS

## 2016-06-20 NOTE — Patient Instructions (Addendum)
It was great seeing you today! We have addressed the following issues today  1. Diabetes: A1c 7.2, which is good for your age. I have sent a prescription for metformin to your pharmacy. He is pickup the prescription and start taking.  2.   Cholesterol: Sent a prescription for Lipitor to your pharmacy. Please pick up this prescription and start taking.  3.   Blood pressure: Your blood pressure is 134/82. Your goal blood pressure is less than 140/90. I recommend a lot of vegetables and fruits in her diet and daily exercise as below.  4.  Eye pain: I have ordered a referral to ophthalmologist (eye doctor). Someone will get in touch with you about this in the next 2-3 weeks.  5.  General Wellness: Please come back and see us in 2-3 weeks. We have more things to address.    If we did any lab work today, and the results require attention, either me or my nurse will get in touch with you. If everything is normal, you will get a letter in mail. If you don't hear from us in two weeks, please give us a call. Otherwise, we look forward to seeing you again at your next visit. If you have any questions or concerns before then, please call the clinic at [phone].   Please bring all your medications to every doctors visit   Sign up for My Chart to have easy access to your labs results, and communication with your Primary care physician.     Please check-out at the front desk before leaving the clinic.    Portion Size    Choose healthier foods such as 100% whole grains, vegetables, fruits, beans, nut seeds, olive oil, most vegetable oils, fat-free dietary, wild game and fish.   Avoid sweet tea, other sweetened beverages, soda, fruit juice, cold cereal and milk and trans fat.   Eat at least 3 meals and 1-2 snacks per day.  Aim for no more than 5 hours between eating.  Eat breakfast within one hour of getting up.    Exercise at least 150 minutes per week, including weight resistance  exercises 3 or 4 times per week.   Try to lose at least 7-10% of your current body weight.

## 2016-06-20 NOTE — Assessment & Plan Note (Addendum)
-  Lipitor 40 mg daily. We'll adjust dose based on ASCVD risk score after lipid panel -Lipid panel today -Aspirin 81 mg daily

## 2016-06-21 ENCOUNTER — Encounter: Payer: Self-pay | Admitting: Student

## 2016-06-21 LAB — MICROALBUMIN / CREATININE URINE RATIO
CREATININE, URINE: 68 mg/dL (ref 20–320)
Microalb Creat Ratio: 15 mcg/mg creat (ref <30)
Microalb, Ur: 1 mg/dL

## 2016-06-21 NOTE — Progress Notes (Signed)
CMP, TSH and Urine microalbumin/creatinine normal Lipid panel with high cholesterol and triglyceride. Will do fasting lipid panel  Sent result letter with the follow message: Below are the results from your recent visit: they are normal except for one of your cholesterol number which is high. This can happen for a couple of reasons  1. If we checked your blood after lunch which is likely because your blood sugar was also high 2. If you eat foods rich in sugar or carbohydrate quite often.   We will repeat the blood work when your return to clinic. I recommend to schedule an appointment in the morning so that we can check your cholesterol level again before you eat.

## 2016-07-08 LAB — HM DIABETES EYE EXAM

## 2016-07-11 ENCOUNTER — Ambulatory Visit: Payer: Medicaid Other | Admitting: Student

## 2016-07-26 ENCOUNTER — Encounter: Payer: Self-pay | Admitting: Student

## 2016-07-26 ENCOUNTER — Ambulatory Visit (INDEPENDENT_AMBULATORY_CARE_PROVIDER_SITE_OTHER): Payer: Medicaid Other | Admitting: Student

## 2016-07-26 VITALS — BP 132/80 | HR 74 | Temp 98.1°F | Ht 63.0 in | Wt 144.0 lb

## 2016-07-26 DIAGNOSIS — E785 Hyperlipidemia, unspecified: Secondary | ICD-10-CM | POA: Diagnosis not present

## 2016-07-26 DIAGNOSIS — M25561 Pain in right knee: Secondary | ICD-10-CM

## 2016-07-26 DIAGNOSIS — M25562 Pain in left knee: Secondary | ICD-10-CM | POA: Diagnosis not present

## 2016-07-26 DIAGNOSIS — M1712 Unilateral primary osteoarthritis, left knee: Secondary | ICD-10-CM | POA: Insufficient documentation

## 2016-07-26 DIAGNOSIS — G8929 Other chronic pain: Secondary | ICD-10-CM

## 2016-07-26 DIAGNOSIS — E1142 Type 2 diabetes mellitus with diabetic polyneuropathy: Secondary | ICD-10-CM

## 2016-07-26 DIAGNOSIS — I10 Essential (primary) hypertension: Secondary | ICD-10-CM | POA: Diagnosis not present

## 2016-07-26 MED ORDER — LISINOPRIL 5 MG PO TABS: 5.0000 mg | ORAL_TABLET | Freq: Every day | ORAL | 3 refills | Status: DC

## 2016-07-26 MED ORDER — METFORMIN HCL 500 MG PO TABS: 500.0000 mg | ORAL_TABLET | Freq: Two times a day (BID) | ORAL | 0 refills | Status: DC

## 2016-07-26 NOTE — Assessment & Plan Note (Signed)
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 No acute changes except for intermittent worsening. Exam with mild tenderness around left patella medially and some crepitus bilaterally. She has no joint line tenderness. No signs of septic joint. No red flags. Will hold of X-ray right know as there is no acute changes.   Recommended trying tylenol. If no improvement, can try Ibuprofen intermittently.  Recommended icing for 10-15 minutes as needed for pain.

## 2016-07-26 NOTE — Assessment & Plan Note (Signed)
Last A1c 7.2 about a month ago. She reports tolerating metformin 500 mg daily. Foot exam within normal limit. [facility]. Will try to get the records.   Increase metformin to 500 mg twice a day  Start lisinopril 5 mg daily  BMP and BP check in a week, then follow up for diabetes in two months.

## 2016-07-26 NOTE — Patient Instructions (Addendum)
It was great seeing you today! We have addressed the following issues today 1.  knee pain: this is likely due to arthritis which is a change that happens with aging. I recommended taking Tylenol over-the-counter 3-4 times a day for pain. You can also try Advil/ibuprofen intermittently. I recommended icing as well. 2.   Diabetes: I have increased your metformin to 500 mg twice a day. I have also started a medication to protect your kidney from diabetes. Please come back and have your kidney numbers checked in a week. Please schedule a morning visit for fasting blood work.   If we did any lab work today, and the results require attention, either me or my nurse will get in touch with you. If everything is normal, you will get a letter in mail and a message via . If you don't hear from us in two weeks, please give us a call. Otherwise, we look forward to seeing you again at your next visit. If you have any questions or concerns before then, please call the clinic at [phone].  Please bring all your medications to every doctors visit  Sign up for My Chart to have easy access to your labs results, and communication with your Primary care physician.    Please check-out at the front desk before leaving the clinic.    Take Care,   Dr. Alanda SlimGonfa

## 2016-07-26 NOTE — Progress Notes (Signed)
Subjective:    Martha Gillespie is a 76 y.o. old female here for knee pain and diabetes [employer] interpretor with ID # [id number] was used for this encounter. History was provided by patient and daughter. HPI Knee pain: for one year. No recent change. Mod to severe pain at times. Left > right. Pain is mild now. Pain is mainly at night. Spasm in her calf when she has more pain. Reports pain in her back occasionally. Mild swelling when she has knee pain.  Denies numbness or tingling in her feet. Denies fever, chills, fatigue, unintentional weight loss, bowel or bladder issue, history of trauma or procedure.   Diabetes: last A1c 7.2 about a month ago. Reports tolerating her metformin. Had an eye exam after I saw her in clinic about a month ago. She was given a prescription for eye glass. She was also started on Atorvastatin at her last visit with me and reports tolerating that as well  PMH/Problem List: has Tobacco abuse; Antral ulcer; Diabetes mellitus (HCC); COPD (chronic obstructive pulmonary disease) (HCC); HTN (hypertension); Eye pain, bilateral; Hyperlipidemia; Neuropathic pain; Routine adult health maintenance; and Bilateral knee pain on her problem list.   has a past medical history of Diabetes mellitus (HCC) (02/15/2012) and Hypertension.  FH:  No family history on file.  SH Social History  Substance Use Topics  . Smoking status: Former Smoker    Types: Cigarettes    Quit date: 07/25/2013  . Smokeless tobacco: Never Used  . Alcohol use No    Review of Systems Review of systems negative except for pertinent positives and negatives in history of present illness above.     Objective:     Vitals:   07/26/16 1500  BP: 132/80  Pulse: 74  Temp: 98.1 F (36.7 C)  TempSrc: Oral  SpO2: 93%  Weight: 144 lb (65.3 kg)  Height: 5\' 3"  (1.6 m)    Physical Exam GEN: appears well, no apparent distress. CVS: RRR, nl S1&S2, no murmurs, no edema,  2+ DP & PT pulses bilaterally RESP: speaks in full  sentence, no IWOB MSK: Normal to inspection with no erythema or effusion or obvious bony abnormalities. No warmth to touch. Mild tenderness medial to her left patella. No joint line tenderness, condyle tenderness. ROM full in flexion and extension and lower leg rotation. Ligaments with solid consistent endpoints including ACL, PCL, LCL, MCL. Patellar glide with some crepitus. Patellar and quadriceps tendons unremarkable. Hamstring and quadriceps strength is normal.   Normal gait  Normal sensation in right leg, foot. Neurovascularly intact with good distal pulses.  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 No acute changes except for intermittent worsening. Exam with mild tenderness around left patella medially and some crepitus bilaterally. She has no joint line tenderness. No signs of septic joint. No red flags. Will hold of X-ray right know as there is no acute changes.   Recommended trying tylenol. If no improvement, can try Ibuprofen intermittently.  Recommended icing for 10-15 minutes as needed for pain.   Diabetes mellitus Last A1c 7.2 about a month ago. She reports tolerating metformin 500 mg daily. Foot exam within normal limit. Had eye exam at [facility] recently. Will try to get the records.   Increase metformin to 500 mg twice a day  Start lisinopril 5 mg daily  BMP  and BP check in a week, then follow up for diabetes in two months.   HTN (hypertension) At goal. Started lisinopril 5 mg daily for renal protection.  Follow up in a week   BMP in a week. Future order placed.  Hyperlipidemia Had triglyceride to 600's recently.   Fasting lipid panel next week. Future order placed.     Orders Placed This Encounter  Procedures  . Lipid Panel    Fasting    Standing  Status:   Future    Standing Expiration Date:   07/26/2017  . BASIC METABOLIC PANEL WITH GFR    Standing Status:   Future    Standing Expiration Date:   07/26/2017    Return in about 1 week (around 08/02/2016) for BMP, HTN and fasting lipid.  Almon Herculesaye T Gonfa, MD 07/26/16 Pager: [phone]

## 2016-07-26 NOTE — Assessment & Plan Note (Signed)
Had triglyceride to 600's recently.   Fasting lipid panel next week. Future order placed.

## 2016-07-26 NOTE — Assessment & Plan Note (Signed)
At goal. Started lisinopril 5 mg daily for renal protection.  Follow up in a week   BMP in a week. Future order placed.

## 2016-08-03 ENCOUNTER — Ambulatory Visit: Payer: Medicaid Other | Admitting: Family Medicine

## 2016-09-05 ENCOUNTER — Ambulatory Visit (INDEPENDENT_AMBULATORY_CARE_PROVIDER_SITE_OTHER): Payer: Medicaid Other | Admitting: Student

## 2016-09-05 ENCOUNTER — Ambulatory Visit (HOSPITAL_COMMUNITY)
Admission: RE | Admit: 2016-09-05 | Discharge: 2016-09-05 | Disposition: A | Discharge status: Home / Self Care | Payer: Medicaid Other | Source: Ambulatory Visit | Attending: Family Medicine | Admitting: Family Medicine

## 2016-09-05 ENCOUNTER — Encounter: Payer: Self-pay | Admitting: Student

## 2016-09-05 VITALS — BP 120/68 | HR 83 | Temp 98.3°F | Wt 143.0 lb

## 2016-09-05 DIAGNOSIS — X58XXXA Exposure to other specified factors, initial encounter: Secondary | ICD-10-CM | POA: Insufficient documentation

## 2016-09-05 DIAGNOSIS — M25562 Pain in left knee: Secondary | ICD-10-CM

## 2016-09-05 DIAGNOSIS — M1712 Unilateral primary osteoarthritis, left knee: Secondary | ICD-10-CM | POA: Diagnosis not present

## 2016-09-05 DIAGNOSIS — M11262 Other chondrocalcinosis, left knee: Secondary | ICD-10-CM | POA: Insufficient documentation

## 2016-09-05 DIAGNOSIS — S83012A Lateral subluxation of left patella, initial encounter: Secondary | ICD-10-CM | POA: Diagnosis not present

## 2016-09-05 DIAGNOSIS — I10 Essential (primary) hypertension: Secondary | ICD-10-CM | POA: Diagnosis not present

## 2016-09-05 MED ORDER — MELOXICAM 15 MG PO TABS: 15.0000 mg | ORAL_TABLET | Freq: Every day | ORAL | 0 refills | Status: DC

## 2016-09-05 NOTE — Assessment & Plan Note (Addendum)
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 She has history of bilateral knee pain in her problem list. Doubt septic arthritis or gouty arthritis. She has good renal function.  Will try Mobic 15 mg daily.  DG left knee AP standing, lateral and sunrise Precepted with Dr. Randolm Idol who evaluated patient with me.

## 2016-09-05 NOTE — Progress Notes (Signed)
Subjective:    Martha Gillespie is a 76 y.o. old female here for left knee pain.  [employer] interpreter with ID #[id number] was used for this encounter. HPI Left knee pain: for three months. Pain is worse for the last one day. Inciting factor is unknown. Pain is achy and sharp. Location is over her left knee anteriorly. Pain is intermittent. Stiffness when she tries to get up after sitting for long time and when she tries to sit down after standing for long time. Admits swelling. Denies skin redness, fever, numbness or tingling. No other joint pain. Denies history of gout. Tried tylenol 500 mg twice a day for 4 days without improvement.   PMH/Problem List: has Tobacco abuse; Antral ulcer; Diabetes mellitus (HCC); COPD (chronic obstructive pulmonary disease) (HCC); HTN (hypertension); Eye pain, bilateral; Hyperlipidemia; Neuropathic pain; Routine adult health maintenance; and Acute pain of left knee on her problem list.   has a past medical history of Diabetes mellitus (HCC) (02/15/2012) and Hypertension.  FH:  No family history on file.  SH Social History  Substance Use Topics  . Smoking status: Former Smoker    Types: Cigarettes    Quit date: 07/25/2013  . Smokeless tobacco: Never Used  . Alcohol use No    Review of Systems Review of systems negative except for pertinent positives and negatives in history of present illness above.     Objective:     Vitals:   09/05/16 1503  BP: 120/68  Pulse: 83  Temp: 98.3 F (36.8 C)  TempSrc: Oral  SpO2: 95%  Weight: 143 lb (64.9 kg)    Physical Exam GEN: appears well, no apparent distress. CVS: RRR, nl S1&S2, no murmurs, no edema RESP: speaks in full sentence, no IWOB, good air movement bilaterally, CTAB MSK:  Left Knee exam:  Some swelling and effusion on inspection but without erythema. No warmth to touch. There is joint line tenderness, medial > lateral.  ROM full in flexion and extension and lower leg rotation. Noted some crepitus  bilaterally Ligaments with solid consistent endpoints including ACL, PCL, LCL, MCL. Negative Mcmurray's.  Patellar glide withcrepitus. Hamstring and quadriceps strength is normal.  Normal gait Normal sensation in right leg, foot. Neurovascularly intact with good distal pulses.  NEURO: alert and oiented appropriately, no gross defecits  PSYCH: euthymic mood with congruent affect    Assessment and Plan:  Acute pain of left knee History and exam suggestive for osteoarthritis of the left knee. She has history of bilateral knee pain in her problem list. Doubt septic arthritis or gouty arthritis. She has good renal function.  Will try Mobic 15 mg daily.  DG left knee AP standing, lateral and sunrise Precepted with Dr. Randolm Idol who evaluated patient with me.      Orders Placed This Encounter  Procedures  . DG Knee 3 Views Left    AP Standing, lateral and sun rise    Standing Status:   Future    Number of Occurrences:   1    Standing Expiration Date:   11/05/2017    Order Specific Question:   Reason for Exam (SYMPTOM  OR DIAGNOSIS REQUIRED)    Answer:   Left knee pain    Order Specific Question:   Preferred imaging location?    Answer:   [facility]    Order Specific Question:   Radiology Contrast Protocol - do NOT remove file path    Answer:   \\[email or web address].pdf  . Basic metabolic panel    Return in  about 2 weeks (around 09/19/2016) for Knee pain.  Almon Hercules, MD 09/05/16 Pager: [phone]

## 2016-09-05 NOTE — Patient Instructions (Signed)
It was great seeing you today! We have addressed the following issues today 1. Knee pain: This is likely due to arthritis. I have ordered an x-ray. [facility] for meloxicam to your pharmacy for pain. I also recommend icing up to 4 times a day for at least 15-20 minutes. Follow-up in 2 weeks or early as needed.   If we did any lab work today, and the results require attention, either me or my nurse will get in touch with you. If everything is normal, you will get a letter in mail and a message via . If you don't hear from [place] in two weeks, please give [place] a call. Otherwise, we look forward to seeing you again at your next visit. If you have any questions or concerns before then, please call the clinic at [phone].  Please bring all your medications to every doctors visit  Sign up for My Chart to have easy access to your labs results, and communication with your Primary care physician.    Please check-out at the front desk before leaving the clinic.    Take Care,   Dr. Alanda Slim

## 2016-09-06 ENCOUNTER — Encounter: Payer: Self-pay | Admitting: Student

## 2016-09-06 DIAGNOSIS — Z550 Illiteracy and low-level literacy: Secondary | ICD-10-CM | POA: Insufficient documentation

## 2016-09-06 DIAGNOSIS — Z789 Other specified health status: Secondary | ICD-10-CM | POA: Insufficient documentation

## 2016-09-06 LAB — BASIC METABOLIC PANEL
BUN/Creatinine Ratio: 35 — ABNORMAL HIGH (ref 12–28)
BUN: 22 mg/dL (ref 8–27)
CO2: 22 mmol/L (ref 18–29)
Calcium: 9.5 mg/dL (ref 8.7–10.3)
Chloride: 100 mmol/L (ref 96–106)
Creatinine, Ser: 0.63 mg/dL (ref 0.57–1.00)
GFR calc Af Amer: 102 mL/min/{1.73_m2} (ref >59)
GFR calc non Af Amer: 89 mL/min/{1.73_m2} (ref >59)
Glucose: 108 mg/dL — ABNORMAL HIGH (ref 65–99)
Potassium: 4.9 mmol/L (ref 3.5–5.2)
Sodium: 138 mmol/L (ref 134–144)

## 2016-09-06 NOTE — Progress Notes (Signed)
BMP within normal. Result letter sent to patient. Also discussed about her X-ray finding with her daughter. X-ray showed OA. Recommended continuing her Mobic and icing. Follow up as needed.

## 2016-09-07 ENCOUNTER — Telehealth: Payer: Self-pay | Admitting: Student

## 2016-09-07 NOTE — Telephone Encounter (Signed)
Called and talked to patient's daughter via pacific interpretor with ID # [id number] in regards to medical form she gave for her citizenship application. I discussed about her form with with Dr. Gwendolyn Grant. Patient has no cognitive issue other than low literacy level. So, Dr. Gwendolyn Grant suggested she goes to [facility]. The clinic can assess her contact if they need further information to help her with her application. Patient's daughter voiced understanding. I gave her the address and phone number to [employer] [facility]  Legal services [place]  Address: [address] [place], [place], [place] [address]  Phone: [phone]

## 2016-09-28 ENCOUNTER — Ambulatory Visit (INDEPENDENT_AMBULATORY_CARE_PROVIDER_SITE_OTHER): Payer: Medicaid Other | Admitting: Student

## 2016-09-28 ENCOUNTER — Encounter: Payer: Self-pay | Admitting: Student

## 2016-09-28 VITALS — BP 122/76 | HR 84 | Temp 98.1°F | Ht 63.0 in | Wt 142.6 lb

## 2016-09-28 DIAGNOSIS — E2839 Other primary ovarian failure: Secondary | ICD-10-CM | POA: Diagnosis not present

## 2016-09-28 DIAGNOSIS — E785 Hyperlipidemia, unspecified: Secondary | ICD-10-CM | POA: Diagnosis not present

## 2016-09-28 DIAGNOSIS — Z0000 Encounter for general adult medical examination without abnormal findings: Secondary | ICD-10-CM | POA: Diagnosis not present

## 2016-09-28 DIAGNOSIS — E1142 Type 2 diabetes mellitus with diabetic polyneuropathy: Secondary | ICD-10-CM

## 2016-09-28 DIAGNOSIS — M1712 Unilateral primary osteoarthritis, left knee: Secondary | ICD-10-CM | POA: Diagnosis present

## 2016-09-28 DIAGNOSIS — I10 Essential (primary) hypertension: Secondary | ICD-10-CM | POA: Diagnosis not present

## 2016-09-28 DIAGNOSIS — R413 Other amnesia: Secondary | ICD-10-CM | POA: Diagnosis not present

## 2016-09-28 LAB — POCT GLYCOSYLATED HEMOGLOBIN (HGB A1C): HEMOGLOBIN A1C: 6.7

## 2016-09-28 MED ORDER — ASPIRIN EC 81 MG PO TBEC: 81.0000 mg | DELAYED_RELEASE_TABLET | Freq: Every day | ORAL | 11 refills | Status: DC

## 2016-09-28 MED ORDER — ATORVASTATIN CALCIUM 40 MG PO TABS: 40.0000 mg | ORAL_TABLET | Freq: Every day | ORAL | 3 refills | Status: DC

## 2016-09-28 MED ORDER — LISINOPRIL 5 MG PO TABS
5.0000 mg | ORAL_TABLET | Freq: Every day | ORAL | 3 refills | Status: DC
Start: 2016-09-28 — End: 2018-08-16

## 2016-09-28 NOTE — Progress Notes (Signed)
Subjective:    Martha Gillespie is a 76 y.o. old female here for follow up on her knee pain. She is her with her son-in-law and another family member.  [employer] video interpretor with ID [id number] was used for this encounter HPI Knee pain: patient was here about three weeks ago with left knee pain. At that time the impression was OA. X-ray was obtained and showed lateral patellar subluxation without frank dislocation, and osteoarthritic change, most notably Medially with some degree of chondrocalcinosis.  Patient reports significant improvement in her pain today. She denies swelling or her knee locking. She is still taking mobic.  DM: A1c 6.7 today. She is on metformin 500 mg twice a day. Taking and tolerating her medication.  Hypertension: she says she ran out of her lisinopril. She didn't call the pharmacy for refill. Doesn't check her BP at home. Denies pounding headache, chest pain or dyspnea.   Toward the end of the encounter, one of the family member who accompanied patient to this visit said. "Martha Gillespie is applying for her citizenship. She doesn't speak english to take the citizenship exam. She has memory issue as well. She was told by people who help with the application process that she needs a paper from her doctor".  Martha Gillespie's daughter handed me the form about three weeks ago. At that time, I discussed this with Dr. Gwendolyn Grant and advised her to contact [facility] per his recommendation. She was in agreement with that. Today, this family member says, she was at [facility] and was sent to another agency as her citizenship interview is fast approaching. This new agency gave her the from her daughter brought in about three weeks ago.    PMH/Problem List: has Tobacco abuse; Antral ulcer; Diabetes mellitus (HCC); COPD (chronic obstructive pulmonary disease) (HCC); HTN (hypertension); Eye pain, bilateral; Hyperlipidemia; Neuropathic pain; Routine adult health maintenance; Osteoarthritis of left knee;  Language barrier affecting health care; Limited literacy; and Memory problem on her problem list.   has a past medical history of Diabetes mellitus (HCC) (02/15/2012) and Hypertension.  FH:  No family history on file.  SH Social History  Substance Use Topics  . Smoking status: Former Smoker    Types: Cigarettes    Quit date: 07/25/2013  . Smokeless tobacco: Never Used  . Alcohol use No    Review of Systems Review of systems negative except for pertinent positives and negatives in history of present illness above.     Objective:     Vitals:   09/28/16 1523  BP: 122/76  Pulse: 84  Temp: 98.1 F (36.7 C)  TempSrc: Oral  SpO2: 97%  Weight: 142 lb 9.6 oz (64.7 kg)  Height: 5\' 3"  (1.6 m)    Physical Exam GEN: appears well, no apparent distress. CVS: RRR, nl S1&S2, no murmurs, no edema RESP: speaks in full sentence, no IWOB MSK:   Left knee:  Normal to inspection with no erythema or effusion or obvious bony abnormalities. No warmth to touch. Mild joint line tenderness medially.  ROM full in flexion and extension and lower leg rotation. Ligaments with solid consistent endpoints Patellar glide with some crepitus. Patellar and quadriceps tendons unremarkable.  SKIN: no apparent skin lesion NEURO: alert and oiented appropriately, no gross defecits  PSYCH: quite    Assessment and Plan:  Osteoarthritis of left knee Pain improved with Mobic. Exam with patellar reflex and mild joint tenderness medially. No red flag. -Continue mobic as needed for pain  Diabetes mellitus Controlled. A1c  6.7%. -Continue metformin 500 mg twice a day. Tolerating it well. -Refilled her lisinopril for renal protection -Follow up in 6 months  HTN (hypertension) At goal. Has been out of her lisinopril in days although it was mainly for renal protection.  -Gave paper Rx for lisinopril 5 mg  Memory problem This was brought up in the context of her citizenship application by one of the family  member who accompanied her to this visit. She has not complained about memory issue in the past.   Patient to return for formal evaluation on this. She need MOCA or MMSE but not sure how to administer this as patient doesn't speak english. If she uses in person interpretor, this person has to sign the medical form.   Routine adult health maintenance Strongly encouraged her to get her colonoscopy and mammogram. Gave phone numbers to call and schedule. Ordered DEXA scan today.     Orders Placed This Encounter  Procedures  . DG Bone Density    Standing Status:   Future    Standing Expiration Date:   11/28/2017    Order Specific Question:   Reason for Exam (SYMPTOM  OR DIAGNOSIS REQUIRED)    Answer:   Estrogen deficiency    Order Specific Question:   Preferred imaging location?    [facility]  . HgB A1c    Return in about 2 weeks (around 10/12/2016) for Evaluation for Dementia.  Almon Herculesaye T Victorino Fatzinger, MD 09/30/16 Pager: [phone]

## 2016-09-28 NOTE — Patient Instructions (Addendum)
It was great seeing you today! We have addressed the following issues today 1. Memory issue: please schedule a follow-up appointment in 1-2 weeks for evaluation on this.  2.   Blood pressure: Please take the prescription for the pharmacy to have your blood pressure medication filled 3.   Diabetes: Your A1c 6.7 which is good. Continue taking the metformin 4.   Colon cancer screening: please call the number below to schedule your colon cancer screening/colonoscopy 5.   Breast cancer screening: Please call the number we gave you to schedule for mammogram or breast cancer screening 6.  Screening for osteoporosis: we have sent an order to imaging center. You can go the imaging center using the address we gave you to have this done.   If we did any lab work today, and the results require attention, either me or my nurse will get in touch with you. If everything is normal, you will get a letter in mail and a message via . If you don't hear from us in two weeks, please give us a call. Otherwise, we look forward to seeing you again at your next visit. If you have any questions or concerns before then, please call the clinic at [phone].  Please bring all your medications to every doctors visit  Sign up for My Chart to have easy access to your labs results, and communication with your Primary care physician.    Please check-out at the front desk before leaving the clinic.    Take Care,   Dr. Alanda SlimGonfa             Colon Cancer  People with early colon cancer usually have no warning signs or symptoms.  If found early, most patients can be cured, but if found when it has already spread, the chance of survival is not as good.  Colon cancer is the second most common cause of concern is in the [place] with over 56,000 deaths from colon cancer in 2005  Colon cancer is a common, treatable disease. Screening tests can find a cancer  early, before you have symptoms, and make it more likely that you will  survive the disease.  Who needs to be tested? If you are age 74-75 yrs, you should be tested for colon cancer.  Ways to be tested:  A colonoscopy the best test to detect colon cancer. It requires you to drink a bowel preparation to clean out your colon before the test. During this test, a tube with a camera inserted into your rectum and examines your entire colon. You can be given medicine to make you sleepy during the exam. Therefore, you will not be able to drive immediately after the test. There is a small risk of bowel injury during the test.   Stool cards that you can take home and take a sample of your stool is another option. The cards are not as good as colonoscopy at detecting cancer, but the tests are easier and cheaper.   To schedule the colonoscopy, you can call one of the 3 options below:  [facility]. Phone number: [phone]  [facility]. Phone number: [phone]   [facility]: Phone number [phone]

## 2016-09-30 DIAGNOSIS — R413 Other amnesia: Secondary | ICD-10-CM | POA: Insufficient documentation

## 2016-09-30 NOTE — Assessment & Plan Note (Addendum)
Controlled. A1c 6.7%. -Continue metformin 500 mg twice a day. Tolerating it well. -Refilled her lisinopril for renal protection -Follow up in 6 months

## 2016-09-30 NOTE — Assessment & Plan Note (Signed)
At goal. Has been out of her lisinopril in days although it was mainly for renal protection.  -Gave paper Rx for lisinopril 5 mg

## 2016-09-30 NOTE — Assessment & Plan Note (Signed)
Strongly encouraged her to get her colonoscopy and mammogram. Gave phone numbers to call and schedule. Ordered DEXA scan today.

## 2016-09-30 NOTE — Assessment & Plan Note (Signed)
Pain improved with Mobic. Exam with patellar reflex and mild joint tenderness medially. No red flag. -Continue mobic as needed for pain

## 2016-09-30 NOTE — Assessment & Plan Note (Signed)
This was brought up in the context of her citizenship application by one of the family member who accompanied her to this visit. She has not complained about memory issue in the past.   Patient to return for formal evaluation on this. She need MOCA or MMSE but not sure how to administer this as patient doesn't speak english. If she uses in person interpretor, this person has to sign the medical form.

## 2016-10-13 ENCOUNTER — Other Ambulatory Visit: Payer: Self-pay | Admitting: Student

## 2016-10-13 DIAGNOSIS — Z1231 Encounter for screening mammogram for malignant neoplasm of breast: Secondary | ICD-10-CM

## 2016-10-14 ENCOUNTER — Ambulatory Visit: Payer: Medicaid Other | Admitting: Student

## 2016-10-15 ENCOUNTER — Telehealth: Payer: Self-pay | Admitting: Student

## 2016-10-15 NOTE — Telephone Encounter (Signed)
Patient was scheduled for 11/15/2016 with Dr. Gwendolyn GrantWalden for medical disability evaluation for her citizenship application. When I called her to let her know about this apt, someone who described himself as her son picked up the phone. He says, they will be out of the town from late-June to mid-July and requested another apt after late-July. Unfortunately, I could not make this schedule for her. So, I advised him to call the front office for assistance. I cancelled her apt for 11/15/2016.

## 2016-10-17 ENCOUNTER — Ambulatory Visit (INDEPENDENT_AMBULATORY_CARE_PROVIDER_SITE_OTHER): Payer: Medicaid Other | Admitting: Student

## 2016-10-17 ENCOUNTER — Encounter: Payer: Self-pay | Admitting: Student

## 2016-10-17 DIAGNOSIS — B86 Scabies: Secondary | ICD-10-CM | POA: Insufficient documentation

## 2016-10-17 MED ORDER — PERMETHRIN 5 % EX CREA: 2.0000 "application " | TOPICAL_CREAM | Freq: Once | CUTANEOUS | 0 refills | Status: AC

## 2016-10-17 MED ORDER — DIPHENHYDRAMINE HCL 25 MG PO CAPS: 25.0000 mg | ORAL_CAPSULE | Freq: Three times a day (TID) | ORAL | 0 refills | Status: DC | PRN

## 2016-10-17 NOTE — Assessment & Plan Note (Signed)
Morphology of rash and location bewteen digits, concerning for scabies - will give permethrin - benadryl for itching

## 2016-10-17 NOTE — Patient Instructions (Signed)
Follow up in 1 week if not improving Take Permethrin as prescribed If anyone else in the home starts to have itching, they will need to be treated too Make sure you wash ALL clothes and bed linens Call the office with questions or concerns

## 2016-10-17 NOTE — Progress Notes (Signed)
   Subjective:    Patient ID: Martha Gillespie, female    DOB: 04/27/1941, 76 y.o.   MRN: [id number]   CC: itching rash  HPI: 76 y/o F presents for itching rash  Rash - noted 3 days ago - diffuse body itching but some bumps noted between fingers, and on arms and legs where she has scratched - she does not share a bed with any one - no one else in the house has complained of itching - she reports using moisturizing lotion daily  Smoking status reviewed  Review of Systems  Per HPI, else denies recent illness, fever, chest pain, shortness of breath,    Objective:  BP 122/70   Pulse 86   Temp 98.3 F (36.8 C) (Oral)   Wt 139 lb (63 kg)   SpO2 91%   BMI 24.62 kg/m  Vitals and nursing note reviewed  General: NAD Cardiac: RRR,  Respiratory: CTAB, normal effort Skin: mildly dry skin with erythematous bumps between her fingers on bilateral hands, some signs of excoriation on bilateral arms and legs, some papules in a linear distribution on bilateral arms Neuro: alert and oriented, no focal deficits   Assessment & Plan:    Scabies Morphology of rash and location bewteen digits, concerning for scabies - will give permethrin - benadryl for itching    Cheyene Hamric A. Kennon RoundsHaney MD, MS Family Medicine Resident PGY-3 Pager [phone]

## 2016-10-27 ENCOUNTER — Other Ambulatory Visit: Payer: Medicaid Other

## 2016-10-27 ENCOUNTER — Ambulatory Visit: Payer: Medicaid Other

## 2016-11-08 ENCOUNTER — Ambulatory Visit: Payer: Medicaid Other

## 2016-11-08 ENCOUNTER — Other Ambulatory Visit: Payer: Medicaid Other

## 2016-11-14 ENCOUNTER — Ambulatory Visit: Payer: Medicaid Other | Admitting: Student

## 2016-11-14 ENCOUNTER — Ambulatory Visit: Payer: Medicaid Other

## 2016-11-15 ENCOUNTER — Ambulatory Visit: Payer: Medicaid Other

## 2016-11-29 ENCOUNTER — Encounter: Payer: Self-pay | Admitting: Student

## 2016-11-29 ENCOUNTER — Ambulatory Visit (INDEPENDENT_AMBULATORY_CARE_PROVIDER_SITE_OTHER): Payer: Medicaid Other | Admitting: Student

## 2016-11-29 VITALS — BP 130/66 | HR 84 | Temp 98.2°F | Ht 63.0 in | Wt 139.6 lb

## 2016-11-29 DIAGNOSIS — B86 Scabies: Secondary | ICD-10-CM

## 2016-11-29 DIAGNOSIS — Z23 Encounter for immunization: Secondary | ICD-10-CM

## 2016-11-29 DIAGNOSIS — Z0000 Encounter for general adult medical examination without abnormal findings: Secondary | ICD-10-CM

## 2016-11-29 DIAGNOSIS — I10 Essential (primary) hypertension: Secondary | ICD-10-CM

## 2016-11-29 MED ORDER — PERMETHRIN 5 % EX CREA: 1.0000 "application " | TOPICAL_CREAM | Freq: Once | CUTANEOUS | 0 refills | Status: AC

## 2016-11-29 MED ORDER — ZOSTER VACCINE LIVE 19400 UNT/0.65ML ~~LOC~~ SUSR: 0.6500 mL | Freq: Once | SUBCUTANEOUS | 0 refills | Status: AC

## 2016-11-29 NOTE — Progress Notes (Signed)
Subjective:    Martha Gillespie is a 76 y.o. old female here for itching. Patient is here with her daughter. Video interpretor was used for the major part of the encounter.   HPI Itching: patient was treated to scabies with permethrin cream about a month ago with subsequent resolution of her itching. Now she comes back with two days of itching. She denies rash. It felt like when she was treated from scabies about a month ago. She denies new medicine, soap or cosmetics. Denies recent illness. She denies swelling, fever or pain. Itching is limited to her hands bilaterally, especially between her fingers. No family member with similar problem.   PMH/Problem List: has Tobacco abuse; Antral ulcer; Diabetes mellitus (HCC); COPD (chronic obstructive pulmonary disease) (HCC); HTN (hypertension); Eye pain, bilateral; Hyperlipidemia; Neuropathic pain; Routine adult health maintenance; Osteoarthritis of left knee; Language barrier affecting health care; Limited literacy; Memory problem; and Scabies on her problem list.   has a past medical history of Diabetes mellitus (HCC) (02/15/2012) and Hypertension.  FH:  No family history on file.  SH Social History  Substance Use Topics  . Smoking status: Former Smoker    Types: Cigarettes    Quit date: 07/25/2013  . Smokeless tobacco: Never Used  . Alcohol use No    Review of Systems Review of systems negative except for pertinent positives and negatives in history of present illness above.     Objective:     Vitals:   11/29/16 1120  BP: 130/66  Pulse: 84  Temp: 98.2 F (36.8 C)  TempSrc: Oral  SpO2: 92%  Weight: 139 lb 9.6 oz (63.3 kg)  Height: 5\' 3"  (1.6 m)    Physical Exam GEN: appears well, no apparent distress. Eyes: conjunctiva without injection, sclera anicteric HEM: negative for cervical or periauricular lymphadenopathies CVS: RRR, nl S1&S2, no murmurs, no edema RESP: speaks in full sentence, no IWOB MSK: no focal tenderness or notable  swelling in her hands or arms SKIN: dry skin in her arms especially between her fingers, no other apparent lesion.  NEURO: alert and oiented appropriately, no gross defecits     Assessment and Plan:  Scabies Pruritus could be as a result of previous scabies infection, treatment failure or re-infestation. There is no notable lesion except for skin dryness. Will try permethrin cream again. Advised to wash clothes and linens with warm water and detergent. She has prescription for Benadryl. Warned her about the side effect of this medications. Recommended using Vaseline for skin dryness in her hands.   HTN (hypertension) Normotensive. She is on lisinopril. Recheck BMP today  Routine adult health maintenance Prevnar 13 today Gave her phone number for mammogram Will order DEXA scan next visit  Patient likes to have her medical disability form for her application for her naturalization filled today. I initially scheduled her with Dr. Gwendolyn Grant for evaluation on 11/15/2016 but she requested cancellation at that time as she was out of the town with her family. I will talk to Dr. Gwendolyn Grant and try rescheduling this again.   Orders Placed This Encounter  Procedures  . Pneumococcal conjugate vaccine 13-valent IM  . Basic metabolic panel   Meds ordered this encounter  Medications  . Zoster Vaccine Live, PF, (ZOSTAVAX) 66440 UNT/0.65ML injection    Sig: Inject 19,400 Units into the skin once.    Dispense:  0.65 mL    Refill:  0  . permethrin (ELIMITE) 5 % cream    Sig: Apply 1 application topically once.  Dispense:  60 g    Refill:  0   Return in about 2 months (around 01/30/2017) for DM & HTN.  Almon Herculesaye T Gonfa, MD 11/29/16 Pager: [phone]

## 2016-11-29 NOTE — Assessment & Plan Note (Signed)
Normotensive. She is on lisinopril. Recheck BMP today

## 2016-11-29 NOTE — Assessment & Plan Note (Signed)
Pruritus could be as a result of previous scabies infection, treatment failure or re-infestation. There is no notable lesion except for skin dryness. Will try permethrin cream again. Advised to wash clothes and linens with warm water and detergent. She has prescription for Benadryl. Warned her about the side effect of this medications. Recommended using Vaseline for skin dryness in her hands.

## 2016-11-29 NOTE — Assessment & Plan Note (Addendum)
Prevnar 13 today. Gave Rx for Zostavax Gave her phone number for mammogram Will order DEXA scan next visit

## 2016-11-29 NOTE — Patient Instructions (Addendum)
It was great seeing you today! We have addressed the following issues today 1. Itching: I have sent a prescription for permethrin cream to your pharmacy. I also recommended washing you closing and a bed sheet with warm water and good detergents. I also recommend using regular Vaseline to keep your skin moist. You can continue using the diphenhydramine. However I would like you to be cautious as this medication could make him sleepy and increase the risk of fall 2.   Paperwork: I will schedule this with one of my colleague and let you know  3.  Shingles vaccine: Please take the prescription we gave you to the pharmacy to have your shingles vaccine  If we did any lab work today, and the results require attention, either me or my nurse will get in touch with you. If everything is normal, you will get a letter in mail and a message via . If you don't hear from us in two weeks, please give us a call. Otherwise, we look forward to seeing you again at your next visit. If you have any questions or concerns before then, please call the clinic at [phone].  Please bring all your medications to every doctors visit  Sign up for My Chart to have easy access to your labs results, and communication with your Primary care physician.    Please check-out at the front desk before leaving the clinic.    Take Care,   Dr. Alanda SlimGonfa  .tgco

## 2016-11-30 ENCOUNTER — Other Ambulatory Visit: Payer: Self-pay | Admitting: Student

## 2016-11-30 DIAGNOSIS — E1142 Type 2 diabetes mellitus with diabetic polyneuropathy: Secondary | ICD-10-CM

## 2016-11-30 DIAGNOSIS — E785 Hyperlipidemia, unspecified: Secondary | ICD-10-CM

## 2016-12-01 ENCOUNTER — Encounter: Payer: Self-pay | Admitting: Student

## 2016-12-01 LAB — BASIC METABOLIC PANEL
BUN/Creatinine Ratio: 30 — ABNORMAL HIGH (ref 12–28)
BUN: 16 mg/dL (ref 8–27)
CHLORIDE: 99 mmol/L (ref 96–106)
CO2: 20 mmol/L (ref 20–29)
Calcium: 9.8 mg/dL (ref 8.7–10.3)
Creatinine, Ser: 0.54 mg/dL — ABNORMAL LOW (ref 0.57–1.00)
GFR calc Af Amer: 108 mL/min/{1.73_m2} (ref >59)
GFR calc non Af Amer: 93 mL/min/{1.73_m2} (ref >59)
GLUCOSE: 113 mg/dL — AB (ref 65–99)
Potassium: 5.1 mmol/L (ref 3.5–5.2)
SODIUM: 139 mmol/L (ref 134–144)

## 2016-12-08 ENCOUNTER — Telehealth: Payer: Self-pay | Admitting: Student

## 2016-12-09 ENCOUNTER — Telehealth: Payer: Self-pay | Admitting: Student

## 2016-12-09 NOTE — Telephone Encounter (Signed)
Called and talked daughter about the schedule change. After apologizing about the change, told her the new schedule is for 12/20/2016 at 1:30 pm. She voices understanding and appreciated the call.

## 2016-12-09 NOTE — Telephone Encounter (Signed)
Telephone call about apt. See my other note.

## 2016-12-20 ENCOUNTER — Ambulatory Visit (INDEPENDENT_AMBULATORY_CARE_PROVIDER_SITE_OTHER): Payer: Medicaid Other | Admitting: Family Medicine

## 2016-12-20 VITALS — BP 128/70 | HR 86 | Temp 98.5°F | Wt 141.2 lb

## 2016-12-20 DIAGNOSIS — R413 Other amnesia: Secondary | ICD-10-CM

## 2016-12-20 NOTE — Assessment & Plan Note (Signed)
  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

## 2016-12-20 NOTE — Progress Notes (Signed)
    Subjective:    Patient ID: Martha Gillespie, female    DOB: 07/01/1940, 76 y.o.   MRN: [id number]   CC: here for N648 immigration form  Patient is here with an in person interpreter and her daughter-in-law.  She has no complaints.  She cannot remember when she moved to [name]. Unsure where she lived before or why she came here. Her daughter in law (Martha Gillespie) states she has memory problems that have worsened over the past 6 years. She states they were together in [name] in refugee [place] and Ms. Martha Gillespie had a poor memory at that time as well but not this bad. Per Martha Gillespie she cannot do any ADLs or IADLs on her own including toileting, bathing, dressing, preparing food/meals, handling her finances, cleaning her living space, doing her shopping,  And she has to be supervised 24/7. She has an aide at home 5-6 hours per day. She lives with son, Martha Gillespie, and 2 grandchildren. She never forgets who her family members are. She does not ever appear confused. She cannot drive and has never tried to get her license. She has not tried to take the American citizenship test before. She has never had any formal education and cannot read or write in her native language or [name]. She walks with a cane due to left knee pain but denies injury to the area. She was told there was fluid in her left knee.   Smoking status reviewed- former chewing tobacco use  Review of Systems- see HPI  Objective:  BP 128/70   Pulse 86   Temp 98.5 F (36.9 C) (Oral)   Wt 141 lb 3.2 oz (64 kg)   SpO2 92%   BMI 25.01 kg/m  Vitals and nursing note reviewed  General: elderly lady, well nourished, in no acute distress HEENT: normocephalic, TM's visualized bilaterally, no scleral icterus or conjunctival pallor, no nasal discharge, moist mucous membranes, good dentition without erythema or discharge noted in posterior oropharynx Neck: supple, non-tender, without lymphadenopathy Cardiac: RRR, clear S1 and S2, no murmurs, rubs, or  gallops Respiratory: clear to auscultation bilaterally, no increased work of breathing Abdomen: soft, nontender, nondistended, no masses or organomegaly. Bowel sounds present Extremities: no edema or cyanosis. Warm, well perfused. 2+ radial and PT pulses bilaterally Skin: warm and dry, no rashes noted Neuro: alert and oriented, no focal deficits  MMSE: did not complete questions 4, 7, and 9 as they do not translate and patient has no formal education. These were worth a total of 7 points making total possible score for patient 23. 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     No Follow-up on file.   Dolores PattyAngela Shahram Alexopoulos, DO Family Medicine Resident PGY-2  ----------------------------------------------------- Addendum:   - Completed [phone] form and given to patient to give to [facility].   Tobey GrimWalden, Jeffrey H, MD

## 2016-12-21 ENCOUNTER — Ambulatory Visit: Payer: Medicaid Other

## 2016-12-28 ENCOUNTER — Ambulatory Visit
Admission: RE | Admit: 2016-12-28 | Discharge: 2016-12-28 | Disposition: A | Discharge status: Home / Self Care | Payer: Medicaid Other | Source: Ambulatory Visit | Attending: Family Medicine | Admitting: Family Medicine

## 2016-12-28 DIAGNOSIS — Z1231 Encounter for screening mammogram for malignant neoplasm of breast: Secondary | ICD-10-CM

## 2017-01-30 ENCOUNTER — Ambulatory Visit: Payer: Medicaid Other | Admitting: Student

## 2017-03-08 ENCOUNTER — Ambulatory Visit: Payer: Medicaid Other | Admitting: Student

## 2017-03-23 ENCOUNTER — Ambulatory Visit: Payer: Medicaid Other | Admitting: Student

## 2017-03-23 ENCOUNTER — Encounter: Payer: Self-pay | Admitting: Student

## 2017-03-23 ENCOUNTER — Other Ambulatory Visit: Payer: Self-pay

## 2017-03-23 VITALS — BP 130/76 | HR 96 | Temp 98.3°F | Ht 63.0 in | Wt 144.0 lb

## 2017-03-23 DIAGNOSIS — Z130 Encounter for screening for diseases of the blood and blood-forming organs and certain disorders involving the immune mechanism: Secondary | ICD-10-CM

## 2017-03-23 DIAGNOSIS — Z23 Encounter for immunization: Secondary | ICD-10-CM

## 2017-03-23 DIAGNOSIS — E2839 Other primary ovarian failure: Secondary | ICD-10-CM

## 2017-03-23 DIAGNOSIS — I10 Essential (primary) hypertension: Secondary | ICD-10-CM | POA: Diagnosis not present

## 2017-03-23 DIAGNOSIS — Z1211 Encounter for screening for malignant neoplasm of colon: Secondary | ICD-10-CM

## 2017-03-23 DIAGNOSIS — Z8669 Personal history of other diseases of the nervous system and sense organs: Secondary | ICD-10-CM

## 2017-03-23 DIAGNOSIS — E1142 Type 2 diabetes mellitus with diabetic polyneuropathy: Secondary | ICD-10-CM | POA: Diagnosis not present

## 2017-03-23 LAB — POCT GLYCOSYLATED HEMOGLOBIN (HGB A1C): Hemoglobin A1C: 7.1

## 2017-03-23 MED ORDER — CETIRIZINE HCL 10 MG PO TABS: 10.0000 mg | ORAL_TABLET | Freq: Every day | ORAL | 11 refills | Status: DC

## 2017-03-23 NOTE — Patient Instructions (Addendum)
It was great seeing you today! We have addressed the following issues today  Diabetes: You can continue taking your metformin.  Keep up the exercise and diet.  You may come back and see Martha Gillespie in 6 months.  Hypertension: Your blood pressure is 130/76.  Your goal blood pressure is less than 140/90.  Continue exercise and diet.  Screening for colon cancer: Please call one of the number below to schedule for colonoscopy  Craning for osteoporosis: We have scheduled the appointment for this.    If we did any lab work today, and the results require attention, either me or my nurse will get in touch with you. If everything is normal, you will get a letter in mail and a message via . If you don't hear from Martha Gillespie in two weeks, please give Martha Gillespie a call. Otherwise, we look forward to seeing you again at your next visit. If you have any questions or concerns before then, please call the clinic at [phone].  Please bring all your medications to every doctors visit  Sign up for My Chart to have easy access to your labs results, and communication with your Primary care physician.    Please check-out at the front desk before leaving the clinic.    Take Care,   Dr. Alanda SlimGonfa      Colon Cancer People with early colon cancer usually have no warning signs or symptoms. If found early, most patients can be cured, but if found when it has already spread, the chance of survival is not as good. Colon cancer is the second most common cause of concern is in the [place] with over 56,000 deaths from colon cancer in 2005 Colon cancer is a common, treatable disease. Screening tests can find a cancer  early, before you have symptoms, and make it more likely that you will survive the disease.  Who needs to be tested?  If you are age 74-75 yrs, you should be tested for colon cancer.  Ways to be tested: A colonoscopy the best test to detect colon cancer. It requires you to drink a bowel preparation to clean out your colon  before the test. During this test, a tube with a camera inserted into your rectum and examines your entire colon. You can be given medicine to make you sleepy during the exam. Therefore, you will not be able to drive immediately after the test. There is a small risk of bowel injury during the test.  Stool cards that you can take home and take a sample of your stool is another option. The cards are not as good as colonoscopy at detecting cancer, but the tests are easier and cheaper.   To schedule the colonoscopy, you can call one of the 3 options below: [facility]. Phone number: [phone] [facility]. Phone number: [phone]  [facility]: Phone number [phone]

## 2017-03-23 NOTE — Progress Notes (Signed)
Subjective:    Martha Gillespie is a 76 y.o. old female here for follow-up on diabetes and hypertension.  She is here with her daughter who is also an interpreter after signing a waiver form.  HPI Diabetes: A1c 7.1% today.  She was supposedly on metformin but has not been taking it.  Daughter is managing her medication.  She did not go back to refill her metformin after she ran out. She walks daily for 30 minutes.  She eats vegetables and fish.  She had a annual eye exam about 3 months ago. She has an upcoming dental apt. she denies numbness in the legs, vision change, polyuria polydipsia.    Hypertension: she is only on lisinopril 5 mg for renal protection.  She is not on any other medication.  She walks daily and eat healthy.   PMH/Problem List: has Tobacco abuse; Antral ulcer; Diabetes mellitus (HCC); COPD (chronic obstructive pulmonary disease) (HCC); HTN (hypertension); Eye pain, bilateral; Hyperlipidemia; Neuropathic pain; Routine adult health maintenance; Osteoarthritis of left knee; Language barrier affecting health care; Limited literacy; Memory problem; and Scabies on their problem list.   has a past medical history of Diabetes mellitus (HCC) (02/15/2012) and Hypertension.  FH:  No family history on file.  SH Social History   Tobacco Use  . Smoking status: Former Smoker    Types: Cigarettes    Last attempt to quit: 07/25/2013    Years since quitting: 3.6  . Smokeless tobacco: Never Used  Substance Use Topics  . Alcohol use: No  . Drug use: No    Review of Systems Review of systems negative except for pertinent positives and negatives in history of present illness above.     Objective:     Vitals:   03/23/17 1551  BP: 130/76  Pulse: 96  Temp: 98.3 F (36.8 C)  TempSrc: Oral  SpO2: 94%  Weight: 144 lb (65.3 kg)  Height: 5\' 3"  (1.6 m)   Body mass index is 25.51 kg/m.  Physical Exam GENERAL: appears well, no ditress HEENT: PERRLA, no conjunctival injection or eye  discharge, MMM, poor dentition LUNGS:  No IWOB, good air movement, CTAB HEART:  RRR with no M/R/G ABD:  soft, NT with active BS EXT:  No C/E/E NEURO:  A&O x 3 PSYCH: Quiet for most of this encounter.  Daughter does most of the talking.    Assessment and Plan:  1. Type 2 diabetes mellitus with diabetic polyneuropathy, without long-term current use of insulin (HCC): Well-controlled.  A1c 7.1%.  We will continue her metformin. Encourage her to keep up the exercise and diet. She has an ASCVD risk score of 36%.  She is on atorvastatin 40 mg and baby aspirin.  She is also on lisinopril for renal protection.  Follow-up in 6 months  2. Estrogen deficiency - DG Bone Density; Future scheduled  3. Essential hypertension: Well-controlled.  Continue lisinopril 5 mg daily, mainly for renal protection due to his diabetes. - Basic metabolic panel  4. Screening for iron deficiency anemia - CBC with Differential/Platelet  5. History of itching of eye - cetirizine (ZYRTEC) 10 MG tablet; Take 1 tablet (10 mg total) daily by mouth.  Dispense: 30 tablet; Refill: 11  6. Need for immunization against influenza - Flu Vaccine QUAD 36+ mos IM  7.  Screening for colon cancer: gave phone number to schedule for colonoscopy  Return in about 6 months (around 09/20/2017) for DM and HTN.  Almon Herculesaye T Gonfa, MD 03/23/17 Pager: [phone]

## 2017-03-24 LAB — CBC WITH DIFFERENTIAL/PLATELET
BASOS ABS: 0.1 10*3/uL (ref 0.0–0.2)
Basos: 1 %
EOS (ABSOLUTE): 0.2 10*3/uL (ref 0.0–0.4)
Eos: 1 %
HEMOGLOBIN: 13.1 g/dL (ref 11.1–15.9)
Hematocrit: 40.4 % (ref 34.0–46.6)
IMMATURE GRANS (ABS): 0 10*3/uL (ref 0.0–0.1)
IMMATURE GRANULOCYTES: 0 %
LYMPHS: 19 %
Lymphocytes Absolute: 2.3 10*3/uL (ref 0.7–3.1)
MCH: 28.4 pg (ref 26.6–33.0)
MCHC: 32.4 g/dL (ref 31.5–35.7)
MCV: 87 fL (ref 79–97)
MONOCYTES: 6 %
Monocytes Absolute: 0.8 10*3/uL (ref 0.1–0.9)
NEUTROS ABS: 8.8 10*3/uL — AB (ref 1.4–7.0)
NEUTROS PCT: 73 %
PLATELETS: 303 10*3/uL (ref 150–379)
RBC: 4.62 x10E6/uL (ref 3.77–5.28)
RDW: 14.2 % (ref 12.3–15.4)
WBC: 12.1 10*3/uL — ABNORMAL HIGH (ref 3.4–10.8)

## 2017-03-24 LAB — BASIC METABOLIC PANEL
BUN/Creatinine Ratio: 25 (ref 12–28)
BUN: 16 mg/dL (ref 8–27)
CALCIUM: 9.1 mg/dL (ref 8.7–10.3)
CHLORIDE: 99 mmol/L (ref 96–106)
CO2: 24 mmol/L (ref 20–29)
Creatinine, Ser: 0.65 mg/dL (ref 0.57–1.00)
GFR calc non Af Amer: 88 mL/min/{1.73_m2} (ref >59)
GFR, EST AFRICAN AMERICAN: 101 mL/min/{1.73_m2} (ref >59)
Glucose: 300 mg/dL — ABNORMAL HIGH (ref 65–99)
Potassium: 4.5 mmol/L (ref 3.5–5.2)
Sodium: 139 mmol/L (ref 134–144)

## 2017-04-21 ENCOUNTER — Inpatient Hospital Stay: Admission: RE | Admit: 2017-04-21 | Payer: Medicaid Other | Source: Ambulatory Visit

## 2017-05-29 ENCOUNTER — Ambulatory Visit: Payer: Medicaid Other | Admitting: Student

## 2017-06-19 ENCOUNTER — Ambulatory Visit: Payer: Medicaid Other | Admitting: Student

## 2017-06-19 ENCOUNTER — Encounter: Payer: Self-pay | Admitting: Student

## 2017-06-19 ENCOUNTER — Other Ambulatory Visit: Payer: Self-pay

## 2017-06-19 VITALS — BP 130/82 | HR 96 | Temp 98.3°F | Ht 63.0 in | Wt 148.8 lb

## 2017-06-19 DIAGNOSIS — M1712 Unilateral primary osteoarthritis, left knee: Secondary | ICD-10-CM | POA: Diagnosis not present

## 2017-06-19 DIAGNOSIS — E1142 Type 2 diabetes mellitus with diabetic polyneuropathy: Secondary | ICD-10-CM | POA: Diagnosis present

## 2017-06-19 DIAGNOSIS — Z79899 Other long term (current) drug therapy: Secondary | ICD-10-CM | POA: Diagnosis not present

## 2017-06-19 DIAGNOSIS — I10 Essential (primary) hypertension: Secondary | ICD-10-CM

## 2017-06-19 DIAGNOSIS — E2839 Other primary ovarian failure: Secondary | ICD-10-CM

## 2017-06-19 DIAGNOSIS — E785 Hyperlipidemia, unspecified: Secondary | ICD-10-CM | POA: Diagnosis not present

## 2017-06-19 LAB — POCT GLYCOSYLATED HEMOGLOBIN (HGB A1C): Hemoglobin A1C: 9.1

## 2017-06-19 MED ORDER — MELOXICAM 15 MG PO TABS: 15.0000 mg | ORAL_TABLET | Freq: Every day | ORAL | 0 refills | Status: DC

## 2017-06-19 MED ORDER — METFORMIN HCL 1000 MG PO TABS: 1000.0000 mg | ORAL_TABLET | Freq: Two times a day (BID) | ORAL | 3 refills | Status: DC

## 2017-06-19 NOTE — Patient Instructions (Addendum)
It was great seeing you today! We have addressed the following issues today  Diabetes: A1c is 9.1%.  Your goal A1c is less than 8.0%.  We increased your metformin from 500 mg to 1000 mg twice a day.  I also recommend lifestyle change including diet and exercise as below.  Please minimize the amount of breath and rice you consume daily.  Left knee pain: This is likely due to osteoarthritis.  I refilled your meloxicam today.  You can also take Tylenol 650 mg every 6 hours.  I recommend icing for 10-15 minutes after every activity.  Please come back and see us if no improvement in your pain.  Bone density: Please call the number we gave you to schedule for bone density exam.   If we did any lab work today, and the results require attention, either me or my nurse will get in touch with you. If everything is normal, you will get a letter in mail and a message via . If you don't hear from us in two weeks, please give us a call. Otherwise, we look forward to seeing you again at your next visit. If you have any questions or concerns before then, please call the clinic at [phone].  Please bring all your medications to every doctors visit  Sign up for My Chart to have easy access to your labs results, and communication with your Primary care physician.    Please check-out at the front desk before leaving the clinic.    Take Care,   Dr. Alanda SlimGonfa  Portion Size   Choose healthier foods such as 100% whole grains, vegetables, fruits, beans, nut seeds, olive oil, most vegetable oils, fat-free dietary, wild game and fish.   Avoid sweet tea, other sweetened beverages, soda, fruit juice, cold cereal and milk and trans fat.   Eat at least 3 meals and 1-2 snacks per day.  Aim for no more than 5 hours between eating.  Eat breakfast within one hour of getting up.    Exercise at least 150 minutes per week, including weight resistance exercises 3 or 4 times per week.   Try to lose at least 7-10% of  your current body weight.   Limit your salt (Sodium) intake to less than 2 gm (2000 mg) a day if you have conditions such as  elevated blood pressure, heart failure...    You may also read about DASH and/or Mediterranean diet at the following web site if you have blood pressure or heart condition. [employer]com.cywww.mayoclinic.org/healthy-lifestyle/nutrition-and-healthy-eating/in-depth/dash-diet http://m[place]inney.org/www.mayoclinic.org/healthy-lifestyle/nutrition-and-healthy-eating/in-depth/mediterranean-diet/

## 2017-06-19 NOTE — Progress Notes (Signed)
Subjective:    Martha Gillespie is a 77 y.o. old female here for follow up on diabetes Harle Stanford, patient's daughter was used as an Engineer, technical sales for this encounter.  HPI Diabetes: has been taking metformin 500 mg twice a day. Takes this everyday. Walks almost everyday for 15 minutes. Walking is limited due to left knee pain. Eats home made bread, rice, squash, carrots, spinach, beans. Occasional potatoes. She doesn't like meat. Eats fish twice day. Doesn't drink soda. She likes oranges juices. Drinks one cup a day. Drinks two cups of 2% milk a day.  Denies vision changes but reports burning sensation. Some numbness in her feet bilaterally. Denies polyuria but feels thirsty especially at night time.   PMH/Problem List: has Tobacco abuse; Antral ulcer; Diabetes mellitus (HCC); COPD (chronic obstructive pulmonary disease) (HCC); HTN (hypertension); Eye pain, bilateral; Hyperlipidemia; Neuropathic pain; Routine adult health maintenance; Osteoarthritis of left knee; Language barrier affecting health care; Limited literacy; Memory problem; and Scabies on their problem list.   has a past medical history of Diabetes mellitus (HCC) (02/15/2012) and Hypertension.  FH:  No family history on file.  SH Social History   Tobacco Use  . Smoking status: Former Smoker    Types: Cigarettes    Last attempt to quit: 07/25/2013    Years since quitting: 3.9  . Smokeless tobacco: Never Used  Substance Use Topics  . Alcohol use: No  . Drug use: No    Review of Systems Review of systems negative except for pertinent positives and negatives in history of present illness above.     Objective:     Vitals:   06/19/17 1407  BP: 130/82  Pulse: 96  Temp: 98.3 F (36.8 C)  TempSrc: Oral  SpO2: 94%  Weight: 148 lb 12.8 oz (67.5 kg)  Height: 5\' 3"  (1.6 m)   Body mass index is 26.36 kg/m.  Physical Exam  GEN: appears well, no apparent distress. CVS: RRR, nl s1 & s2, no murmurs, no edema RESP: no IWOB, good air  movement bilaterally, CTAB GU: no suprapubic or CVA tenderness MSK:  knee Normal to inspection with no erythema or effusion or obvious bony abnormalities. No warmth to touch. Has joint line tenderness in her left knee ROM full in flexion and extension and lower leg rotation. Patellar glide with crepitus. Ligaments with solid consistent endpoints including ACL, PCL, LCL, MCL. Normal gait  Normal sensation in right leg, foot.   Neurovascularly intact with good distal pulses. SKIN: no apparent skin lesion NEURO: alert and oiented appropriately, no gross deficits  PSYCH: euthymic mood with congruent affect    Assessment and Plan:  1. Type 2 diabetes mellitus with diabetic polyneuropathy, without long-term current use of insulin (HCC): Poorly controlled.  A1c 9.1% today.  Previously 7.1%.  Increased her metformin from 500 mg twice a day to 1000 mg twice a day.  Emphasized about the importance of lifestyle change including diet and exercise.  Recommended cutting down on his bread and rice consumption.  Gave a handout about portion size and good food choices as well.  Will manage her knee pain as below.  Follow-up in 3 months.  She is on ACE inhibitors for renal protection.  She is on aspirin and statin as well.  She is up-to-date on pneumonia vaccine and eye exam.  2. Estrogen deficiency: - DG Bone Density; Future  -Patient to call and schedule this.  She canceled 2 previous appointments  3. Pharmacologic therapy: Patient on metformin for diabetes. - Vitamin  B12  4. Hyperlipidemia, unspecified hyperlipidemia type -Continue Lipitor - Lipid panel  5.  Osteoarthritis of left knee: Exam with joint line tenderness.  No effusion or erythema.  Refilled her meloxicam today.  Recommended Tylenol every 6 hours and frequent icing especially after walking.  Discussed about steroid injection option if no improvement with the above measures. - meloxicam (MOBIC) 15 MG tablet; Take 1 tablet (15 mg total)  by mouth daily.  Dispense: 30 tablet; Refill: 0  6. Essential hypertension: Blood pressure well controlled. -On lisinopril for renal protection. - Basic metabolic panel  Return in about 3 months (around 09/16/2017) for DM.  Almon Herculesaye T Giancarlo Askren, MD 06/19/17 Pager: [phone]

## 2017-06-20 ENCOUNTER — Encounter: Payer: Self-pay | Admitting: Student

## 2017-06-20 LAB — LIPID PANEL
CHOLESTEROL TOTAL: 167 mg/dL (ref 100–199)
Chol/HDL Ratio: 4.8 ratio — ABNORMAL HIGH (ref 0.0–4.4)
HDL: 35 mg/dL — AB (ref >39)
Triglycerides: 449 mg/dL — ABNORMAL HIGH (ref 0–149)

## 2017-06-20 LAB — VITAMIN B12: Vitamin B-12: 566 pg/mL (ref 232–1245)

## 2017-06-20 LAB — BASIC METABOLIC PANEL
BUN/Creatinine Ratio: 26 (ref 12–28)
BUN: 17 mg/dL (ref 8–27)
CALCIUM: 9.7 mg/dL (ref 8.7–10.3)
CO2: 24 mmol/L (ref 20–29)
CREATININE: 0.66 mg/dL (ref 0.57–1.00)
Chloride: 99 mmol/L (ref 96–106)
GFR, EST AFRICAN AMERICAN: 100 mL/min/{1.73_m2} (ref >59)
GFR, EST NON AFRICAN AMERICAN: 87 mL/min/{1.73_m2} (ref >59)
Glucose: 285 mg/dL — ABNORMAL HIGH (ref 65–99)
Potassium: 4.4 mmol/L (ref 3.5–5.2)
Sodium: 138 mmol/L (ref 134–144)

## 2017-07-03 ENCOUNTER — Other Ambulatory Visit: Payer: Self-pay | Admitting: Student

## 2017-07-03 DIAGNOSIS — E785 Hyperlipidemia, unspecified: Secondary | ICD-10-CM

## 2017-07-03 DIAGNOSIS — E1142 Type 2 diabetes mellitus with diabetic polyneuropathy: Secondary | ICD-10-CM

## 2017-07-07 ENCOUNTER — Ambulatory Visit
Admission: RE | Admit: 2017-07-07 | Discharge: 2017-07-07 | Disposition: A | Discharge status: Home / Self Care | Payer: Medicaid Other | Source: Ambulatory Visit | Attending: Family Medicine | Admitting: Family Medicine

## 2017-07-07 ENCOUNTER — Encounter: Payer: Self-pay | Admitting: Student

## 2017-07-07 DIAGNOSIS — E2839 Other primary ovarian failure: Secondary | ICD-10-CM

## 2017-07-07 NOTE — Progress Notes (Signed)
Discussed about Dexa finding with patient's daughter. Scheduled an apt for 07/11/17 at 9:50 am. Patient's daughter to bring in medication bottles.

## 2017-07-09 ENCOUNTER — Other Ambulatory Visit: Payer: Self-pay | Admitting: Student

## 2017-07-11 ENCOUNTER — Encounter: Payer: Self-pay | Admitting: Student

## 2017-07-11 ENCOUNTER — Other Ambulatory Visit: Payer: Self-pay

## 2017-07-11 ENCOUNTER — Ambulatory Visit (INDEPENDENT_AMBULATORY_CARE_PROVIDER_SITE_OTHER): Payer: Medicaid Other | Admitting: Student

## 2017-07-11 VITALS — BP 122/70 | HR 85 | Temp 97.9°F | Wt 146.0 lb

## 2017-07-11 DIAGNOSIS — M810 Age-related osteoporosis without current pathological fracture: Secondary | ICD-10-CM

## 2017-07-11 DIAGNOSIS — H01139 Eczematous dermatitis of unspecified eye, unspecified eyelid: Secondary | ICD-10-CM | POA: Diagnosis not present

## 2017-07-11 MED ORDER — TRIAMCINOLONE ACETONIDE 0.5 % EX OINT: 1.0000 "application " | TOPICAL_OINTMENT | Freq: Two times a day (BID) | CUTANEOUS | 0 refills | Status: AC

## 2017-07-11 MED ORDER — ALENDRONATE SODIUM 70 MG PO TABS: 70.0000 mg | ORAL_TABLET | ORAL | 11 refills | Status: DC

## 2017-07-11 NOTE — Patient Instructions (Signed)
It was great seeing you today! We have addressed the following issues today  Osteoporosis: We started you on medication.  You take this medication weekly.  Take the medication in the morning before breakfast with a full glass of water.  You should stay upright for 30-60 minutes after taking this medication.  I also recommend taking multivitamin, woman's 50+ which is available over-the-counter.  See below for more information about osteoporosis.   If we did any lab work today, and the results require attention, either me or my nurse will get in touch with you. If everything is normal, you will get a letter in mail and a message via . If you don't hear from us in two weeks, please give us a call. Otherwise, we look forward to seeing you again at your next visit. If you have any questions or concerns before then, please call the clinic at [phone].  Please bring all your medications to every doctors visit  Sign up for My Chart to have easy access to your labs results, and communication with your Primary care physician.    Please check-out at the front desk before leaving the clinic.    Take Care,   Dr. Alanda SlimGonfa   Osteoporosis Osteoporosis happens when your bones become thinner and weaker. Weak bones can break (fracture) more easily when you slip or fall. Bones most at risk of breaking are in the hip, wrist, and spine. Follow these instructions at home:  Get enough calcium and vitamin D. These nutrients are good for your bones.  Exercise as told by your doctor.  Do not use any tobacco products. This includes cigarettes, chewing tobacco, and electronic cigarettes. If you need help quitting, ask your doctor.  Limit the amount of alcohol you drink.  Take medicines only as told by your doctor.  Keep all follow-up visits as told by your doctor. This is important.  Take care at home to prevent falls. Some ways to do this are: ? Keep rooms well lit and tidy. ? Put safety rails on your  stairs. ? Put a rubber mat in the bathroom and other places that are often wet or slippery. Get help right away if:  You fall.  You hurt yourself. This information is not intended to replace advice given to you by your health care provider. Make sure you discuss any questions you have with your health care provider. Document Released: 07/25/2011 Document Revised: 10/08/2015 Document Reviewed: 10/10/2013 Elsevier Interactive Patient Education  [employer]2018 Elsevier Inc.

## 2017-07-11 NOTE — Progress Notes (Signed)
  Subjective:    Martha Gillespie is a 77 y.o. old female here for osteoporosis. She is here with her daughter who helps with her interpretation  HPI Osteoporosis: Patient had a DEXA scan about 4 days ago that showed a T score of -3.2 at L1-L3 and T score of 2.4 at right femoral neck.  She denies history of previous fracture.  She has no history of kidney disease.  She reports history of occasional heartburn.  She is not on medication for this.  She is taking multivitamin over-the-counter.  She is not on any medication that could contribute to osteoporosis.  PMH/Problem List: has Tobacco abuse; Antral ulcer; Diabetes mellitus (HCC); COPD (chronic obstructive pulmonary disease) (HCC); HTN (hypertension); Eye pain, bilateral; Hyperlipidemia; Neuropathic pain; Routine adult health maintenance; Osteoarthritis of left knee; Language barrier affecting health care; Limited literacy; Memory problem; and Scabies on their problem list.   has a past medical history of Diabetes mellitus (HCC) (02/15/2012) and Hypertension.  FH:  No family history on file.  SH Social History   Tobacco Use  . Smoking status: Former Smoker    Types: Cigarettes    Last attempt to quit: 07/25/2013    Years since quitting: 3.9  . Smokeless tobacco: Never Used  Substance Use Topics  . Alcohol use: No  . Drug use: No    Review of Systems Review of systems negative except for pertinent positives and negatives in history of present illness above.     Objective:     Vitals:   07/11/17 0957  BP: 122/70  Pulse: 85  Temp: 97.9 F (36.6 C)  TempSrc: Oral  SpO2: 94%  Weight: 146 lb (66.2 kg)   Body mass index is 25.86 kg/m.  Physical Exam  GEN: appears well, no apparent distress. CVS: RRR, nl s1 & s2, no murmurs, no edema RESP: no IWOB, good air movement bilaterally, CTAB GI: BS present & normal, soft, NTND MSK: no focal tenderness or notable swelling SKIN: Dry scaly skin lesion over the dorsal aspect of her right ring  finger and back of her right elbow ENDO: negative thyromegally NEURO: alert and oiented appropriately, no gross deficits  PSYCH: euthymic mood with congruent affect Assessment and Plan:  1. Age-related osteoporosis without current pathological fracture: Patient with T score of -3.2 at L1-L3 and -2.4 right femoral neck.  He has a fracture score of 24% for major osteoporotic fracture and 10% for femoral neck fracture.  She has a good renal function.  She reports mild occasional heartburn. She is not on any medication that could contribute to her osteoporosis.  Discussed about the risks and benefit of treatment with alendronate.  Gave her a prescription.  Recommend taking with full glass of water before breakfast.  Recommended staying upright for about 30 minutes to 1 hour after taking this medication. Also recommended taking two tablets of multivitamin woman's 50+ daily. Daughter took a picture of the bottle on her phone to get this over-the-counter.  2. Eczematous dermatitis of eyelid, unspecified laterality: Lesion consistent with eczema.  Denies family member with similar.  Gave prescription for triamcinolone ointment.  Also recommended using Vaseline regularly to keep her skin moist. - triamcinolone ointment (KENALOG) 0.5 %; Apply 1 application topically 2 (two) times daily for 14 days.  Dispense: 30 g; Refill: 0 Return in about 3 months (around 10/08/2017) for Diabetes.  Almon Herculesaye T Gonfa, MD 07/11/17 Pager: [phone]

## 2017-07-31 ENCOUNTER — Encounter: Payer: Self-pay | Admitting: Student

## 2017-07-31 ENCOUNTER — Telehealth: Payer: Self-pay | Admitting: Student

## 2017-07-31 DIAGNOSIS — M810 Age-related osteoporosis without current pathological fracture: Secondary | ICD-10-CM | POA: Insufficient documentation

## 2017-07-31 NOTE — Telephone Encounter (Signed)
Last progress note, medical history and medication list printed and placed in outgoing fax box [facility]

## 2017-08-03 ENCOUNTER — Other Ambulatory Visit: Payer: Self-pay | Admitting: Student

## 2017-08-03 DIAGNOSIS — E1142 Type 2 diabetes mellitus with diabetic polyneuropathy: Secondary | ICD-10-CM

## 2017-08-03 DIAGNOSIS — E785 Hyperlipidemia, unspecified: Secondary | ICD-10-CM

## 2017-08-11 ENCOUNTER — Telehealth: Payer: Self-pay

## 2017-08-11 NOTE — Telephone Encounter (Signed)
Martha EndoKelly- case manager calling to find out diagnosis for patients meloxicam and gabapentin. Per problem list pt has osteoartiritis of L knee and neuropathic pain.  Shawna OrleansMeredith B Thomsen, RN

## 2017-09-03 ENCOUNTER — Other Ambulatory Visit: Payer: Self-pay | Admitting: Student

## 2017-09-03 DIAGNOSIS — E1142 Type 2 diabetes mellitus with diabetic polyneuropathy: Secondary | ICD-10-CM

## 2017-09-03 DIAGNOSIS — E785 Hyperlipidemia, unspecified: Secondary | ICD-10-CM

## 2017-10-03 ENCOUNTER — Other Ambulatory Visit: Payer: Self-pay | Admitting: Student

## 2017-10-03 DIAGNOSIS — E785 Hyperlipidemia, unspecified: Secondary | ICD-10-CM

## 2017-10-03 DIAGNOSIS — E1142 Type 2 diabetes mellitus with diabetic polyneuropathy: Secondary | ICD-10-CM

## 2017-10-10 ENCOUNTER — Other Ambulatory Visit: Payer: Self-pay | Admitting: Student

## 2017-10-10 DIAGNOSIS — E785 Hyperlipidemia, unspecified: Secondary | ICD-10-CM

## 2017-10-18 ENCOUNTER — Encounter: Payer: Self-pay | Admitting: Student

## 2017-10-18 ENCOUNTER — Ambulatory Visit: Payer: Medicaid Other | Admitting: Student

## 2017-10-18 ENCOUNTER — Other Ambulatory Visit: Payer: Self-pay

## 2017-10-18 VITALS — BP 140/72 | HR 87 | Temp 98.6°F | Ht 63.0 in | Wt 145.0 lb

## 2017-10-18 DIAGNOSIS — E139 Other specified diabetes mellitus without complications: Secondary | ICD-10-CM

## 2017-10-18 DIAGNOSIS — L299 Pruritus, unspecified: Secondary | ICD-10-CM

## 2017-10-18 DIAGNOSIS — R21 Rash and other nonspecific skin eruption: Secondary | ICD-10-CM | POA: Insufficient documentation

## 2017-10-18 DIAGNOSIS — E1142 Type 2 diabetes mellitus with diabetic polyneuropathy: Secondary | ICD-10-CM | POA: Diagnosis present

## 2017-10-18 LAB — POCT GLYCOSYLATED HEMOGLOBIN (HGB A1C): HBA1C, POC (CONTROLLED DIABETIC RANGE): 7.9 % — AB (ref 0.0–7.0)

## 2017-10-18 MED ORDER — EUCERIN EX CREA: TOPICAL_CREAM | CUTANEOUS | 0 refills | Status: AC | PRN

## 2017-10-18 NOTE — Progress Notes (Signed)
Subjective:    Martha Gillespie is a 77 y.o. old female here for follow-up on diabetes.  She also likes to discuss about generalized pruritus. [employer] with ID number [id number] was used for this encounter.  She is here with a daughter-in-law. HPI  Diabetes: she is on metformin 1000 mg twice a day. Reports good compliance with her medication. No issues with he medication. Does drink soda or juice. Walks daily. She has been to [facility] about 3-4 months ago. She is regularily followed by a dentist.   Pruritus: itching all over her body. This has been going on for 6-7 days. Improved with over-the-counter cream.  She doesn't remember the name of the cream.  Denies skin rash. Denies new soap or cosmetic.  Denies recent illness. No family member with similar issue. Had similar problem in the past. She was tried on Zyrtec without improvement.   Pain and burning in her eyes: for 3-4 days. Improved with over the counter eye drop.  Currently pain-free.  Denies vision change.  Denies eye discharge or drainage.  PMH/Problem List: has Tobacco abuse; Antral ulcer; Diabetes mellitus (HCC); COPD (chronic obstructive pulmonary disease) (HCC); HTN (hypertension); Eye pain, bilateral; Hyperlipidemia; Neuropathic pain; Routine adult health maintenance; Osteoarthritis of left knee; Language barrier affecting health care; Limited literacy; Memory problem; Scabies; Osteoporosis; and Skin rash on their problem list.   has a past medical history of Diabetes mellitus (HCC) (02/15/2012) and Hypertension.  FH:  No family history on file.  SH Social History   Tobacco Use  . Smoking status: Former Smoker    Types: Cigarettes    Last attempt to quit: 07/25/2013    Years since quitting: 4.2  . Smokeless tobacco: Never Used  Substance Use Topics  . Alcohol use: No  . Drug use: No    Review of Systems Review of systems negative except for pertinent positives and negatives in history of present illness above.      Objective:     Vitals:   10/18/17 1024  BP: 140/72  Pulse: 87  Temp: 98.6 F (37 C)  TempSrc: Oral  SpO2: 92%  Weight: 145 lb (65.8 kg)  Height: 5\' 3"  (1.6 m)   Body mass index is 25.69 kg/m.  Physical Exam  GEN: appears well & comfortable. No apparent distress. Head: normocephalic and atraumatic  Eyes: conjunctiva without injection. Sclera anicteric. CVS: RRR, nl s1 & s2, no murmurs, no edema RESP: no IWOB, good air movement bilaterally, CTAB MSK: no focal tenderness or notable swelling SKIN: no apparent lesion except for dry skin in extremities. NEURO: alert and awake, no gross deficits    Diabetic Foot Exam: Inspection: no skin lesion, ulcer. Small callus over the lateral aspect of her right sole. Toenails trimmed  Vascular: DP & PT pulses 2+ bilaterally Neuro: diminished monofilament exam in her big toes bilaterally    Assessment and Plan:  1. Type 2 diabetes mellitus with diabetic polyneuropathy, without long-term current use of insulin (HCC): Suboptimally controlled but improved from prior.  A1c down from 9.1% to 7.9% today.  Good compliance with her metformin.  She walks daily and watch the diet.  Encouraged her to continue lifestyle change and metformin.  She has had an eye exam at [facility] recently.  She is also has regular dental checkup.  She has diminished sensation in her big toes bilaterally and a small callus over the lateral aspect of her right foot.  I recommended wearing a tennis shoe and checking  her feet regularly.  She is on atorvastatin as well as lisinopril.  Follow-up in 3 months.   2. Pruritus: likely due to dry skin.  He has no notable skin rash.  We will try Eucerin.  Commended containing Zyrtec. - Skin Protectants, Misc. (EUCERIN) cream; Apply topically as needed for dry skin.  Dispense: 454 g; Refill: 0  Return in about 3 months (around 01/18/2018) for Diabetes and meet new PCP.  Almon Herculesaye T Gonfa, MD 10/18/17 Pager:  [phone]

## 2017-10-18 NOTE — Patient Instructions (Signed)
It was great seeing you today! We have addressed the following issues today  Itching: We gave you a prescription for Eucerin cream.  This is also available over-the-counter.  Continue using Zyrtec as well.  Diabetes: Your A1c is 7.9%.  It was 9.1% previously.  Continue taking your metformin.  Keep up daily walking.  I recommend wearing a tennis shoe.  Check your feet daily for any skin break.    If we did any lab work today, and the results require attention, either me or my nurse will get in touch with you. If everything is normal, you will get a letter in mail and a message via . If you don't hear from us in two weeks, please give us a call. Otherwise, we look forward to seeing you again at your next visit. If you have any questions or concerns before then, please call the clinic at [phone].  Please bring all your medications to every doctors visit  Sign up for My Chart to have easy access to your labs results, and communication with your Primary care physician.    Please check-out at the front desk before leaving the clinic.    Take Care,   Dr. Alanda SlimGonfa

## 2017-11-04 ENCOUNTER — Other Ambulatory Visit: Payer: Self-pay | Admitting: Student

## 2017-11-04 DIAGNOSIS — E785 Hyperlipidemia, unspecified: Secondary | ICD-10-CM

## 2017-11-04 DIAGNOSIS — E1142 Type 2 diabetes mellitus with diabetic polyneuropathy: Secondary | ICD-10-CM

## 2017-11-24 ENCOUNTER — Telehealth: Payer: Self-pay

## 2017-11-24 NOTE — Telephone Encounter (Signed)
[facility] called nurse line stating they need an update med list faxed to their pharmacy. Pt has switched to them. Fax number [phone]. 

## 2017-11-24 NOTE — Telephone Encounter (Signed)
Routing to PCP to verify med list and then will send copy to pharmacy once verified. Lamonte SakaiZimmerman Rumple, April D, [place]

## 2017-11-30 NOTE — Telephone Encounter (Signed)
Med list reviewed.

## 2017-11-30 NOTE — Telephone Encounter (Signed)
Med list reviewed, printed and faxed to pharmacy. Martha Gillespie, Anup Brigham D, [place]

## 2017-12-12 ENCOUNTER — Other Ambulatory Visit: Payer: Self-pay | Admitting: *Deleted

## 2017-12-12 DIAGNOSIS — Z8669 Personal history of other diseases of the nervous system and sense organs: Secondary | ICD-10-CM

## 2017-12-12 DIAGNOSIS — E1142 Type 2 diabetes mellitus with diabetic polyneuropathy: Secondary | ICD-10-CM

## 2017-12-12 DIAGNOSIS — E785 Hyperlipidemia, unspecified: Secondary | ICD-10-CM

## 2017-12-12 MED ORDER — ASPIRIN 81 MG PO TBEC: 81.0000 mg | DELAYED_RELEASE_TABLET | Freq: Every day | ORAL | 0 refills | Status: DC

## 2017-12-12 MED ORDER — METFORMIN HCL 1000 MG PO TABS: 1000.0000 mg | ORAL_TABLET | Freq: Two times a day (BID) | ORAL | 3 refills | Status: DC

## 2017-12-12 MED ORDER — CETIRIZINE HCL 10 MG PO TABS: 10.0000 mg | ORAL_TABLET | Freq: Every day | ORAL | 11 refills | Status: DC

## 2018-03-19 ENCOUNTER — Other Ambulatory Visit: Payer: Self-pay | Admitting: *Deleted

## 2018-03-20 MED ORDER — CALCIUM-VITAMIN D 600-400 MG-UNIT PO TABS
ORAL_TABLET | ORAL | 0 refills | Status: DC
Start: 2018-03-20 — End: 2018-04-30

## 2018-04-18 ENCOUNTER — Other Ambulatory Visit: Payer: Self-pay

## 2018-04-18 DIAGNOSIS — Z8669 Personal history of other diseases of the nervous system and sense organs: Secondary | ICD-10-CM

## 2018-04-19 MED ORDER — CETIRIZINE HCL 5 MG PO TABS: 5.0000 mg | ORAL_TABLET | Freq: Every day | ORAL | 0 refills | Status: DC

## 2018-04-23 ENCOUNTER — Other Ambulatory Visit: Payer: Self-pay | Admitting: Family Medicine

## 2018-04-30 ENCOUNTER — Other Ambulatory Visit: Payer: Self-pay

## 2018-04-30 MED ORDER — CALCIUM-VITAMIN D 600-400 MG-UNIT PO TABS: ORAL_TABLET | ORAL | 0 refills | Status: DC

## 2018-05-10 ENCOUNTER — Other Ambulatory Visit: Payer: Self-pay | Admitting: Family Medicine

## 2018-05-10 DIAGNOSIS — E1142 Type 2 diabetes mellitus with diabetic polyneuropathy: Secondary | ICD-10-CM

## 2018-05-10 NOTE — Telephone Encounter (Signed)
I am covering Dr. Latanya Maudlinfeng's inbox. Will fill metformin x 1 month, but it looks like patient needs meet new PCP/diabetes recheck with Dr. Latanya MaudlinFeng's next available if you could help her schedule this.

## 2018-06-05 NOTE — Telephone Encounter (Signed)
LVM via [employer] [phone], asking pt to call office back to assist them in getting an appointment scheduled. Lamonte Sakai, April D, [place]

## 2018-06-11 NOTE — Telephone Encounter (Addendum)
Tried to contact pt and daughter-in-law answered phone (uses [employer]#[id number]) and appointment had to be on a Friday due to her work schedule, appointment scheduled on 07/06/18 with PCP. Martha Gillespie Rumple, Kristiane Morsch D, [place]

## 2018-07-06 ENCOUNTER — Ambulatory Visit: Payer: Medicaid Other | Admitting: Student in an Organized Health Care Education/Training Program

## 2018-07-13 ENCOUNTER — Other Ambulatory Visit: Payer: Self-pay | Admitting: Family Medicine

## 2018-07-13 DIAGNOSIS — M810 Age-related osteoporosis without current pathological fracture: Secondary | ICD-10-CM

## 2018-07-30 ENCOUNTER — Other Ambulatory Visit: Payer: Self-pay | Admitting: Student in an Organized Health Care Education/Training Program

## 2018-08-03 ENCOUNTER — Other Ambulatory Visit: Payer: Self-pay | Admitting: Family Medicine

## 2018-08-03 DIAGNOSIS — E1142 Type 2 diabetes mellitus with diabetic polyneuropathy: Secondary | ICD-10-CM

## 2018-08-16 ENCOUNTER — Other Ambulatory Visit: Payer: Self-pay

## 2018-08-16 DIAGNOSIS — E1142 Type 2 diabetes mellitus with diabetic polyneuropathy: Secondary | ICD-10-CM

## 2018-08-16 DIAGNOSIS — I10 Essential (primary) hypertension: Secondary | ICD-10-CM

## 2018-08-16 DIAGNOSIS — M810 Age-related osteoporosis without current pathological fracture: Secondary | ICD-10-CM

## 2018-08-16 DIAGNOSIS — E785 Hyperlipidemia, unspecified: Secondary | ICD-10-CM

## 2018-08-17 MED ORDER — LISINOPRIL 5 MG PO TABS: 5.0000 mg | ORAL_TABLET | Freq: Every day | ORAL | 3 refills | Status: DC

## 2018-08-17 MED ORDER — CETIRIZINE HCL 5 MG PO TABS: 5.0000 mg | ORAL_TABLET | Freq: Every day | ORAL | 0 refills | Status: DC

## 2018-08-17 MED ORDER — ASPIRIN 81 MG PO TBEC: 81.0000 mg | DELAYED_RELEASE_TABLET | Freq: Every day | ORAL | 0 refills | Status: DC

## 2018-08-17 MED ORDER — METFORMIN HCL 1000 MG PO TABS: 1000.0000 mg | ORAL_TABLET | Freq: Two times a day (BID) | ORAL | 0 refills | Status: DC

## 2018-08-17 MED ORDER — ALENDRONATE SODIUM 70 MG PO TABS: ORAL_TABLET | ORAL | 11 refills | Status: DC

## 2018-08-20 ENCOUNTER — Other Ambulatory Visit: Payer: Self-pay

## 2018-08-20 MED ORDER — CALCIUM-VITAMIN D 600-400 MG-UNIT PO TABS: ORAL_TABLET | ORAL | 0 refills | Status: DC

## 2018-09-18 ENCOUNTER — Other Ambulatory Visit: Payer: Self-pay

## 2018-09-19 MED ORDER — CALCIUM-VITAMIN D 600-400 MG-UNIT PO TABS: ORAL_TABLET | ORAL | 0 refills | Status: DC

## 2018-09-25 ENCOUNTER — Other Ambulatory Visit: Payer: Self-pay

## 2018-09-25 DIAGNOSIS — M810 Age-related osteoporosis without current pathological fracture: Secondary | ICD-10-CM

## 2018-09-26 MED ORDER — ALENDRONATE SODIUM 70 MG PO TABS: ORAL_TABLET | ORAL | 0 refills | Status: DC

## 2018-10-05 ENCOUNTER — Ambulatory Visit: Payer: Medicaid Other | Admitting: Family Medicine

## 2018-10-10 ENCOUNTER — Other Ambulatory Visit: Payer: Self-pay

## 2018-10-10 DIAGNOSIS — E785 Hyperlipidemia, unspecified: Secondary | ICD-10-CM

## 2018-10-11 MED ORDER — ATORVASTATIN CALCIUM 40 MG PO TABS: 40.0000 mg | ORAL_TABLET | Freq: Every day | ORAL | 0 refills | Status: DC

## 2018-10-15 ENCOUNTER — Other Ambulatory Visit: Payer: Self-pay

## 2018-10-17 MED ORDER — CETIRIZINE HCL 5 MG PO TABS: 5.0000 mg | ORAL_TABLET | Freq: Every day | ORAL | 0 refills | Status: DC

## 2018-10-26 ENCOUNTER — Ambulatory Visit (INDEPENDENT_AMBULATORY_CARE_PROVIDER_SITE_OTHER): Payer: Medicare Other | Admitting: Student in an Organized Health Care Education/Training Program

## 2018-10-26 ENCOUNTER — Other Ambulatory Visit: Payer: Self-pay

## 2018-10-26 ENCOUNTER — Encounter: Payer: Self-pay | Admitting: Student in an Organized Health Care Education/Training Program

## 2018-10-26 VITALS — BP 110/78 | HR 105 | Temp 99.0°F | Resp 18 | Ht 59.0 in | Wt 141.0 lb

## 2018-10-26 DIAGNOSIS — E1142 Type 2 diabetes mellitus with diabetic polyneuropathy: Secondary | ICD-10-CM

## 2018-10-26 DIAGNOSIS — H918X2 Other specified hearing loss, left ear: Secondary | ICD-10-CM | POA: Diagnosis not present

## 2018-10-26 DIAGNOSIS — H9192 Unspecified hearing loss, left ear: Secondary | ICD-10-CM | POA: Diagnosis not present

## 2018-10-26 DIAGNOSIS — E785 Hyperlipidemia, unspecified: Secondary | ICD-10-CM | POA: Diagnosis not present

## 2018-10-26 LAB — POCT GLYCOSYLATED HEMOGLOBIN (HGB A1C): HbA1c, POC (controlled diabetic range): 7.2 % — AB (ref 0.0–7.0)

## 2018-10-26 NOTE — Progress Notes (Signed)
   CC: Diabetes Follow up  HPI: Martha Gillespie is a 78 y.o. female with PMH significant for T2DM, HTN, COPD, T2DM, OA,  who presents to [facility] today for follow up diabetes.  Diabetes CHRONIC DIABETES  Disease Monitoring  Blood Sugar Ranges: Does not check  Polyuria: no   Visual problems: no  Monitoring: A1c 7.2 from 7.9 one year ago  Medication Compliance: yes  Medication Side Effects  Hypoglycemia: no   Preventitive Health Care  Eye Exam: Due, placed ophtho referral this encounter  Foot Exam: completed, normal  HLD Managed on a statin No CP, Claudiation, or dyspnea  Hearing loss Patient reports that she hear in the left as well as the right. She first noticed this 2.5 months ago. Has difficulty hearing and understanding in the left. No trauma to the ear. Occasional tinnitus which is intermittent.   Review of Symptoms:  See HPI for ROS.   CC, SH/smoking status, and VS noted.  Objective: BP 110/78 (BP Location: Left Arm, Patient Position: Sitting, Cuff Size: Normal)   Pulse (!) 105   Temp 99 F (37.2 C) (Oral)   Resp 18   Ht 4\' 11"  (1.499 m)   Wt 141 lb (64 kg)   SpO2 92%   BMI 28.48 kg/m  GEN: NAD, alert, cooperative, and pleasant. EYE: no conjunctival injection, pupils equally round and reactive to light ENMT: normal tympanic light reflex, no cerumen impaction, no nasal polyps,no rhinorrhea, no pharyngeal erythema or exudates NECK: full ROM RESPIRATORY: clear to auscultation bilaterally with no wheezes, rhonchi or rales, good effort CV: RRR, no m/r/g, no peripheral edema GI: soft, non-tender, non-distended, no hepatosplenomegaly SKIN: warm and dry, no rashes or lesions NEURO: II-XII grossly intact, normal gait, peripheral sensation intact PSYCH: AAOx3, appropriate affect  Diabetic foot exam WNL, documented  Assessment and plan:  Diabetes mellitus  Continue current management. A1c 7.2. Likely can decrease metformin in the future as we do not need tight  glucose control at her age. - Ambulatory referral to Ophthalmology - Vitamin B12 monitoring d/t chronic metformin - Basic metabolic panel - Lipid Panel - follow up A1c in 3 months  Hearing loss Ambulatory referral to ENT for audiometry.  Hyperlipidemia - no red flag symptoms - continue statin - check lipid panel for monitoring   Orders Placed This Encounter  Procedures  . Vitamin B12  . Basic metabolic panel  . Lipid Panel  . Ambulatory referral to Ophthalmology    Referral Priority:   Routine    Referral Type:   Consultation    Referral Reason:   Specialty Services Required    Requested Specialty:   Ophthalmology    Number of Visits Requested:   1  . Ambulatory referral to ENT    Referral Priority:   Routine    Referral Type:   Consultation    Referral Reason:   Specialty Services Required    Requested Specialty:   Otolaryngology    Number of Visits Requested:   1  . HgB A1c    Everrett Coombe, MD,MS,  PGY3 10/27/2018 9:53 PM

## 2018-10-26 NOTE — Patient Instructions (Signed)
It was a pleasure seeing you today in our clinic. Today we discussed your hearing and diabetes. Here is the treatment plan we have discussed and agreed upon together:  We drew blood work at today's visit. I will call or send you a letter with these results. If you do not hear from me within the next week, please give our office a call.  A consult was placed to ENT and the eye doctors at today's visit.  You will receive a call to schedule an appointment. If you do not receive a call within two weeks please call our office so we can place the consult again.  Our clinic's number is [phone]. Please call with questions or concerns about what we discussed today.  Be well, Dr. Burr Medico

## 2018-10-27 ENCOUNTER — Encounter: Payer: Self-pay | Admitting: Student in an Organized Health Care Education/Training Program

## 2018-10-27 DIAGNOSIS — H9190 Unspecified hearing loss, unspecified ear: Secondary | ICD-10-CM | POA: Insufficient documentation

## 2018-10-27 LAB — BASIC METABOLIC PANEL
BUN/Creatinine Ratio: 19 (ref 12–28)
BUN: 12 mg/dL (ref 8–27)
CO2: 25 mmol/L (ref 20–29)
Calcium: 9.9 mg/dL (ref 8.7–10.3)
Chloride: 97 mmol/L (ref 96–106)
Creatinine, Ser: 0.64 mg/dL (ref 0.57–1.00)
GFR calc Af Amer: 100 mL/min/{1.73_m2} (ref >59)
GFR calc non Af Amer: 87 mL/min/{1.73_m2} (ref >59)
Glucose: 169 mg/dL — ABNORMAL HIGH (ref 65–99)
Potassium: 4.2 mmol/L (ref 3.5–5.2)
Sodium: 138 mmol/L (ref 134–144)

## 2018-10-27 LAB — LIPID PANEL
Chol/HDL Ratio: 4 ratio (ref 0.0–4.4)
Cholesterol, Total: 168 mg/dL (ref 100–199)
HDL: 42 mg/dL (ref >39)
LDL Calculated: 53 mg/dL (ref 0–99)
Triglycerides: 364 mg/dL — ABNORMAL HIGH (ref 0–149)
VLDL Cholesterol Cal: 73 mg/dL — ABNORMAL HIGH (ref 5–40)

## 2018-10-27 LAB — VITAMIN B12: Vitamin B-12: 388 pg/mL (ref 232–1245)

## 2018-10-27 NOTE — Assessment & Plan Note (Signed)
Ambulatory referral to ENT for audiometry.

## 2018-10-27 NOTE — Assessment & Plan Note (Addendum)
Continue current management. A1c 7.2. Likely can decrease metformin in the future as we do not need tight glucose control at her age. - Ambulatory referral to Ophthalmology - Vitamin B12 monitoring d/t chronic metformin - Basic metabolic panel - Lipid Panel - follow up A1c in 3 months

## 2018-10-27 NOTE — Assessment & Plan Note (Signed)
-   no red flag symptoms - continue statin - check lipid panel for monitoring

## 2018-10-30 ENCOUNTER — Other Ambulatory Visit: Payer: Self-pay

## 2018-10-30 DIAGNOSIS — E1142 Type 2 diabetes mellitus with diabetic polyneuropathy: Secondary | ICD-10-CM

## 2018-11-01 MED ORDER — METFORMIN HCL 1000 MG PO TABS: 1000.0000 mg | ORAL_TABLET | Freq: Two times a day (BID) | ORAL | 0 refills | Status: DC

## 2018-11-29 LAB — HM DIABETES EYE EXAM

## 2018-12-06 ENCOUNTER — Other Ambulatory Visit: Payer: Self-pay

## 2018-12-06 DIAGNOSIS — Z20822 Contact with and (suspected) exposure to covid-19: Secondary | ICD-10-CM

## 2018-12-09 LAB — NOVEL CORONAVIRUS, NAA: SARS-CoV-2, NAA: NOT DETECTED

## 2018-12-17 ENCOUNTER — Encounter: Payer: Self-pay | Admitting: Family Medicine

## 2019-01-08 ENCOUNTER — Other Ambulatory Visit: Payer: Self-pay | Admitting: Student in an Organized Health Care Education/Training Program

## 2019-01-09 ENCOUNTER — Other Ambulatory Visit: Payer: Self-pay

## 2019-01-09 DIAGNOSIS — E785 Hyperlipidemia, unspecified: Secondary | ICD-10-CM

## 2019-01-09 NOTE — Telephone Encounter (Signed)
[facility] calling to get this medication refilled as soon as possible. Ottis Stain, CMA

## 2019-01-10 MED ORDER — ATORVASTATIN CALCIUM 40 MG PO TABS: 40.0000 mg | ORAL_TABLET | Freq: Every day | ORAL | 3 refills | Status: DC

## 2019-02-01 ENCOUNTER — Other Ambulatory Visit: Payer: Self-pay | Admitting: *Deleted

## 2019-02-01 DIAGNOSIS — E1142 Type 2 diabetes mellitus with diabetic polyneuropathy: Secondary | ICD-10-CM

## 2019-02-01 MED ORDER — METFORMIN HCL 1000 MG PO TABS: 1000.0000 mg | ORAL_TABLET | Freq: Two times a day (BID) | ORAL | 3 refills | Status: DC

## 2019-02-04 ENCOUNTER — Other Ambulatory Visit: Payer: Self-pay | Admitting: *Deleted

## 2019-02-05 MED ORDER — CETIRIZINE HCL 5 MG PO TABS: 5.0000 mg | ORAL_TABLET | Freq: Every day | ORAL | 3 refills | Status: DC

## 2019-04-24 ENCOUNTER — Other Ambulatory Visit: Payer: Self-pay | Admitting: *Deleted

## 2019-04-24 DIAGNOSIS — M810 Age-related osteoporosis without current pathological fracture: Secondary | ICD-10-CM

## 2019-04-24 MED ORDER — ALENDRONATE SODIUM 70 MG PO TABS: ORAL_TABLET | ORAL | 0 refills | Status: DC

## 2019-04-26 ENCOUNTER — Other Ambulatory Visit: Payer: Self-pay | Admitting: Family Medicine

## 2019-06-06 ENCOUNTER — Other Ambulatory Visit: Payer: Self-pay | Admitting: *Deleted

## 2019-06-07 ENCOUNTER — Encounter: Payer: Self-pay | Admitting: Family Medicine

## 2019-06-07 ENCOUNTER — Ambulatory Visit: Payer: Medicare Other | Admitting: Family Medicine

## 2019-06-07 ENCOUNTER — Ambulatory Visit (INDEPENDENT_AMBULATORY_CARE_PROVIDER_SITE_OTHER): Payer: Medicare Other | Admitting: Family Medicine

## 2019-06-07 ENCOUNTER — Other Ambulatory Visit: Payer: Self-pay

## 2019-06-07 VITALS — BP 108/70 | HR 107 | Wt 136.8 lb

## 2019-06-07 DIAGNOSIS — E1142 Type 2 diabetes mellitus with diabetic polyneuropathy: Secondary | ICD-10-CM

## 2019-06-07 DIAGNOSIS — M7502 Adhesive capsulitis of left shoulder: Secondary | ICD-10-CM

## 2019-06-07 LAB — POCT GLYCOSYLATED HEMOGLOBIN (HGB A1C): HbA1c, POC (controlled diabetic range): 6.7 % (ref 0.0–7.0)

## 2019-06-07 MED ORDER — METHYLPREDNISOLONE ACETATE 80 MG/ML IJ SUSP
80.0000 mg | Freq: Once | INTRAMUSCULAR | Status: AC
  Administered 2019-06-07: 17:00:00 80 mg via INTRAMUSCULAR

## 2019-06-07 MED ORDER — CALCIUM CARBONATE-VITAMIN D 600-400 MG-UNIT PO TABS: ORAL_TABLET | ORAL | 0 refills | Status: AC

## 2019-06-07 NOTE — Patient Instructions (Addendum)
Adhesive Capsulitis  Adhesive capsulitis, also called frozen shoulder, causes the shoulder to become stiff and painful to move. This condition happens when there is inflammation of the tendons and ligaments that surround the shoulder joint (shoulder capsule). What are the causes? This condition may be caused by:  An injury to your shoulder joint.  Straining your shoulder.  Not moving your shoulder for a period of time. This can happen if your arm was injured or in a sling.  Long-standing conditions, such as: ? Diabetes. ? Thyroid problems. ? Heart disease. ? Stroke. ? Rheumatoid arthritis. ? Lung disease. In some cases, the cause is not known. What increases the risk? You are more likely to develop this condition if you are:  A woman.  Older than 77 years of age. What are the signs or symptoms? Symptoms of this condition include:  Pain in your shoulder when you move your arm. There may also be pain when parts of your shoulder are touched. The pain may be worse at night or when you are resting.  A sore or aching shoulder.  The inability to move your shoulder normally.  Muscle spasms. How is this diagnosed? This condition is diagnosed with a physical exam and imaging tests, such as an X-ray or MRI. How is this treated? This condition may be treated with:  Treatment of the underlying cause or condition.  Medicine. Medicine may be given to relieve pain, inflammation, or muscle spasms.  Steroid injections into the shoulder joint.  Physical therapy. This involves performing exercises to get the shoulder moving again.  Acupuncture. This is a type of treatment that involves stimulating specific points on your body by inserting thin needles through your skin.  Shoulder manipulation. This is a procedure to move the shoulder into another position. It is done after you are given a medicine to make you fall asleep (general anesthetic). The joint may also be injected with salt  water at high pressure to break down scarring.  Surgery. This may be done in severe cases when other treatments have failed. Although most people recover completely from adhesive capsulitis, some may not regain full shoulder movement. Follow these instructions at home: Managing pain, stiffness, and swelling      If directed, put ice on the injured area: ? Put ice in a plastic bag. ? Place a towel between your skin and the bag. ? Leave the ice on for 20 minutes, 2-3 times per day.  If directed, apply heat to the affected area before you exercise. Use the heat source that your health care provider recommends, such as a moist heat pack or a heating pad. ? Place a towel between your skin and the heat source. ? Leave the heat on for 20-30 minutes. ? Remove the heat if your skin turns bright red. This is especially important if you are unable to feel pain, heat, or cold. You may have a greater risk of getting burned. General instructions  Take over-the-counter and prescription medicines only as told by your health care provider.  If you are being treated with physical therapy, follow instructions from your physical therapist.  Avoid exercises that put a lot of demand on your shoulder, such as throwing. These exercises can make pain worse.  Keep all follow-up visits as told by your health care provider. This is important. Contact a health care provider if:  You develop new symptoms.  Your symptoms get worse. Summary  Adhesive capsulitis, also called frozen shoulder, causes the shoulder to become  stiff and painful to move.  You are more likely to have this condition if you are a woman and over age 83.  It is treated with physical therapy, medicines, and sometimes surgery. This information is not intended to replace advice given to you by your health care provider. Make sure you discuss any questions you have with your health care provider. Document Revised: 10/06/2017 Document  Reviewed: 10/06/2017 Elsevier Patient Education  2020 [employer] Inc[employer]  Shoulder Range of Motion Exercises Shoulder range of motion (ROM) exercises are done to keep the shoulder moving freely or to increase movement. They are often recommended for people who have shoulder pain or stiffness or who are recovering from a shoulder surgery. Phase 1 exercises When you are able, do this exercise 1-2 times per day for 30-60 seconds in each direction, or as directed by your health care provider. Pendulum exercise To do this exercise while sitting: 1. Sit in a chair or at the edge of your bed with your feet flat on the floor. 2. Let your affected arm hang down in front of you over the edge of the bed or chair. 3. Relax your shoulder, arm, and hand. 4. Rock your body so your arm gently swings in small circles. You can also use your unaffected arm to start the motion. 5. Repeat changing the direction of the circles, swinging your arm left and right, and swinging your arm forward and back. To do this exercise while standing: 1. Stand next to a sturdy chair or table, and hold on to it with your hand on your unaffected side. 2. Bend forward at the waist. 3. Bend your knees slightly. 4. Relax your shoulder, arm, and hand. 5. While keeping your shoulder relaxed, use body motion to swing your arm in small circles. 6. Repeat changing the direction of the circles, swinging your arm left and right, and swinging your arm forward and back. 7. Between exercises, stand up tall and take a short break to relax your lower back.  Phase 2 exercises Do these exercises 1-2 times per day or as told by your health care provider. Hold each stretch for 30 seconds, and repeat 3 times. Do the exercises with one or both arms as instructed by your health care provider. For these exercises, sit at a table with your hand and arm supported by the table. A chair that slides easily or has wheels can be helpful. External  rotation 1. Turn your chair so that your affected side is nearest to the table. 2. Place your forearm on the table to your side. Bend your elbow about 90 at the elbow (right angle) and place your hand palm facing down on the table. Your elbow should be about 6 inches away from your side. 3. Keeping your arm on the table, lean your body forward. Abduction 1. Turn your chair so that your affected side is nearest to the table. 2. Place your forearm and hand on the table so that your thumb points toward the ceiling and your arm is straight out to your side. 3. Slide your hand out to the side and away from you, using your unaffected arm to do the work. 4. To increase the stretch, you can slide your chair away from the table. Flexion: forward stretch 1. Sit facing the table. Place your hand and elbow on the table in front of you. 2. Slide your hand forward and away from you, using your unaffected arm to do the work. 3. To increase the  stretch, you can slide your chair backward. Phase 3 exercises Do these exercises 1-2 times per day or as told by your health care provider. Hold each stretch for 30 seconds, and repeat 3 times. Do the exercises with one or both arms as instructed by your health care provider. Cross-body stretch: posterior capsule stretch 1. Lift your arm straight out in front of you. 2. Bend your arm 90 at the elbow (right angle) so your forearm moves across your body. 3. Use your other arm to gently pull the elbow across your body, toward your other shoulder. Wall climbs 1. Stand with your affected arm extended out to the side with your hand resting on a door frame. 2. Slide your hand slowly up the door frame. 3. To increase the stretch, step through the door frame. Keep your body upright and do not lean. Wand exercises You will need a cane, a piece of PVC pipe, or a sturdy wooden dowel for wand exercises. Flexion To do this exercise while standing: 1. Hold the wand with both of  your hands, palms down. 2. Using the other arm to help, lift your arms up and over your head, if able. 3. Push upward with your other arm to gently increase the stretch. To do this exercise while lying down: 1. Lie on your back with your elbows resting on the floor and the wand in both your hands. Your hands will be palm down, or pointing toward your feet. 2. Lift your hands toward the ceiling, using your unaffected arm to help if needed. 3. Bring your arms overhead as able, using your unaffected arm to help if needed. Internal rotation 1. Stand while holding the wand behind you with both hands. Your unaffected arm should be extended above your head with the arm of the affected side extended behind you at the level of your waist. The wand should be pointing straight up and down as you hold it. 2. Slowly pull the wand up behind your back by straightening the elbow of your unaffected arm and bending the elbow of your affected arm. External rotation 1. Lie on your back with your affected upper arm supported on a small pillow or rolled towel. When you first do this exercise, keep your upper arm close to your body. Over time, bring your arm up to a 90 angle out to the side. 2. Hold the wand across your stomach and with both hands palm up. Your elbow on your affected side should be bent at a 90 angle. 3. Use your unaffected side to help push your forearm away from you and toward the floor. Keep your elbow on your affected side bent at a 90 angle. Contact a health care provider if you have:  New or increasing pain.  New numbness, tingling, weakness, or discoloration in your arm or hand. This information is not intended to replace advice given to you by your health care provider. Make sure you discuss any questions you have with your health care provider. Document Revised: 06/14/2017 Document Reviewed: 06/14/2017 Elsevier Patient Education  2020 [employer].

## 2019-06-07 NOTE — Progress Notes (Signed)
   Subjective:    Martha Gillespie - 79 y.o. female MRN [id number]  Date of birth: 10/21/1940  History aided by [place] interpreter present entire visit.  CC:  Martha Gillespie is here for diabetes follow up.  HPI: Type 2 Diabetes Mellitus CBGs: does not check Meds: metformin 1,000 mg BID, atorvastatin Hypo/hyperglycemic symptoms: believes that her sugar rises when she has burning in her stomach as well as feeling dizzy, has noticed increased thirst and urination over the past two weeks Diet: mostly eats fish and vegetables  Shoulder pain L shoulder pain that radiates to her chest, started about one month ago Pain has worsened since then, especially over the past two days Pain is constant but will intermittently worsen or improve, sometimes makes it difficult to move her arm  Cramping in quality Exacerbating factors: none Alleviating factors: massaging the muscles of the chest and shoulder No associated shortness of breath or diaphoresis, nonexertional   Health Maintenance:  -Would like flu shot today. Health Maintenance Due  Topic Date Due  . INFLUENZA VACCINE  12/15/2018    -  reports that she quit smoking about 5 years ago. Her smoking use included cigarettes. She has never used smokeless tobacco. - Review of Systems: Per HPI. - Past Medical History: Patient Active Problem List   Diagnosis Date Noted  . Adhesive capsulitis of left shoulder 06/09/2019  . Hearing loss 10/27/2018  . Skin rash 10/18/2017  . Osteoporosis 07/31/2017  . Scabies 10/17/2016  . Memory problem 09/30/2016  . Language barrier affecting health care 09/06/2016  . Limited literacy 09/06/2016  . Osteoarthritis of left knee 07/26/2016  . Eye pain, bilateral 06/20/2016  . Hyperlipidemia 06/20/2016  . Neuropathic pain 06/20/2016  . Routine adult health maintenance 06/20/2016  . HTN (hypertension) 06/26/2014  . Antral ulcer 02/15/2012  . Diabetes mellitus (HCC) 02/15/2012  . COPD (chronic obstructive  pulmonary disease) (HCC) 02/15/2012  . Tobacco abuse 02/13/2012   - Medications: reviewed and updated   Objective:   Physical Exam BP 108/70   Pulse (!) 107   Wt 136 lb 12.8 oz (62.1 kg)   SpO2 92%   BMI 27.63 kg/m  Gen: NAD, alert, cooperative with exam, well-appearing CV: RRR, good S1/S2, no murmur, no edema Resp: CTABL, no wheezes, non-labored Left shoulder: No visual abnormality.  Tender to palpation along the biceps tendon, lateral left shoulder, and posterior left shoulder around the acromion.  ROM significantly reduced in all planes.  Neurovascularly intact.  Assessment & Plan:   Diabetes mellitus Continues to be well controlled today with an A1c of 6.7.  Will continue metformin 1,000 mg BID.  Congratulated on continued good control.  Adhesive capsulitis of left shoulder Due to patient's age, diabetes, and restricted ROM on both passive and active movement of the shoulder, patient likely has adhesive capsulitis.  Shoulder injection and handout of shoulder mobility exercises provided.  Also encouraged patient to continue having family members massage her shoulder area to increase mobility.  After informed written consent timeout was performed, patient was seated on exam table. Left shoulder was prepped with alcohol swab and utilizing posterior approach, patient's left subacromial space was injected with 4:1 lidocaine: depomedrol. Patient tolerated the procedure well without immediate complications.   Lezlie Octave, M.D. 06/09/2019, 7:21 PM PGY-3, [facility]

## 2019-06-09 DIAGNOSIS — M7502 Adhesive capsulitis of left shoulder: Secondary | ICD-10-CM | POA: Insufficient documentation

## 2019-06-09 NOTE — Assessment & Plan Note (Signed)
Continues to be well controlled today with an A1c of 6.7.  Will continue metformin 1,000 mg BID.  Congratulated on continued good control.

## 2019-06-09 NOTE — Assessment & Plan Note (Signed)
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  Shoulder injection and handout of shoulder mobility exercises provided.  Also encouraged patient to continue having family members massage her shoulder area to increase mobility.

## 2019-07-30 ENCOUNTER — Other Ambulatory Visit: Payer: Self-pay | Admitting: Family Medicine

## 2019-07-30 DIAGNOSIS — M810 Age-related osteoporosis without current pathological fracture: Secondary | ICD-10-CM

## 2019-08-14 ENCOUNTER — Other Ambulatory Visit: Payer: Self-pay

## 2019-08-14 ENCOUNTER — Ambulatory Visit (INDEPENDENT_AMBULATORY_CARE_PROVIDER_SITE_OTHER): Payer: Medicare Other | Admitting: Family Medicine

## 2019-08-14 ENCOUNTER — Encounter: Payer: Self-pay | Admitting: Family Medicine

## 2019-08-14 VITALS — BP 118/60 | HR 94 | Ht 59.0 in | Wt 138.5 lb

## 2019-08-14 DIAGNOSIS — R202 Paresthesia of skin: Secondary | ICD-10-CM | POA: Diagnosis not present

## 2019-08-14 DIAGNOSIS — R42 Dizziness and giddiness: Secondary | ICD-10-CM | POA: Diagnosis not present

## 2019-08-14 DIAGNOSIS — R946 Abnormal results of thyroid function studies: Secondary | ICD-10-CM | POA: Diagnosis not present

## 2019-08-14 DIAGNOSIS — M792 Neuralgia and neuritis, unspecified: Secondary | ICD-10-CM

## 2019-08-14 MED ORDER — GABAPENTIN 100 MG PO CAPS: 100.0000 mg | ORAL_CAPSULE | Freq: Every day | ORAL | 0 refills | Status: DC

## 2019-08-14 NOTE — Patient Instructions (Signed)
It was wonderful to see you today.  Today we will start with obtaining labs to see if any of your electrolytes or blood level are off to cause these changes.  Please make sure that you are well-hydrated and drinking plenty of water.  If you can, I would like you to check your blood pressure and sugar if you are feeling dizzy to make sure that these are not contributing.  If your lab work comes back normal, then we can consider getting nerve conduction studies and sending you to neurology for further evaluation.  If you have significant dizziness, inability to stand or weakness please make sure you go to the emergency room right away.

## 2019-08-14 NOTE — Progress Notes (Signed)
SUBJECTIVE:   CHIEF COMPLAINT / HPI: dizziness/legs burning   Publishing copy used for the duration of the visit, daughter completed end of visit after loss of Nurse, learning disability.   Neuropathy: Reports a 7-10 day history of bilateral lower leg "burning" sensation going from her toes to the level of her knees. She also just feels like her "arms and legs have no energy" in general. Worse at night, keeping her awake. Walking as normal. No falls. Hasn't had this before, but through chart review note in 2018 chart that she reported "neuropathic pain for a long time". Diabetes for the past 6+ years, currently well controlled with metformin. A little lower back pain with it that seems to be worse with the cold, sitting makes it better. No bowel/bladder dysfunction, saddle anesthesia.   Dizziness: Also reports a 7-10 day history of this as well. Describes as constant and episodic (sometimes lasting 1-2 days straight, other days having 1-2 episodes). Occurs while sitting or standing, no relation to change in positions per her report. Can't balance well when it occurs but no falls. Can't describe how it feels. Denies any new onset localized weakness, SOB, chest pain, palpitations, facial drooping, change in speech, or vision changes. No associated tinnitus or hearing loss (however does report she has had those two concerns for the past 2 years). Hasn't checked her BP or sugar when she feels this way. No known COVID exposures, stays at home except doctor's appointments.   PERTINENT  PMH / PSH: Hypertension, COPD, diabetes, hearing loss, osteoporosis, neuropathic pain, hyperlipidemia, tobacco use, osteoarthritis  OBJECTIVE:   BP 118/60   Pulse 94   Ht 4\' 11"  (1.499 m)   Wt 138 lb 8 oz (62.8 kg)   SpO2 96%   BMI 27.97 kg/m   General: Alert, NAD HEENT: NCAT, MMM, TM clear with appropriate light reflex b/l, EOMI, PERRLA, no nystagmus noted looking forward or rotation.  Unable to perform head impulse test well  due to patient participation. Cardiac: RRR, faint systolic murmur intermittently heard best in right 2nd intercostal space with appropriate S1 and S2  Lungs: Clear bilaterally, no increased WOB  Abdomen: soft Msk: Moves all extremities spontaneously, 5/5 lower extremity strength bilaterally, decreased sensation on lateral aspects of dorsal/sole foot and lower leg bilaterally with filament testing. 2/4 patellar reflexes.  Ext: Warm, dry, 2+ distal pulses, no edema b/l   ASSESSMENT/PLAN:   Nonspecific dizziness Reports 7-10 day history of episodic and continuous dizziness without postural association.  Unclear with broad differential, suspect likely multifactorial with possible viral etiology causing associated generalized fatigue (?Vestibular neuritis). Considered BPPV, Mnire's, labyrinthitis, however clinical presentation does not appear to match with this. Neurologically intact suggesting against CVA/central lesion.  Low concern for orthostatic hypotension or hypoglycemia, however could monitor BP/glucose when symptoms arise at home.  May use antiemetics OTC as needed, recommended close follow-up next week for further discussion/evaluation.  Could consider short course of prednisone if not improving, however will hold off for today.  Neuropathic pain Acute, possibly on chronic (previous charting of the symptoms however patient reports it is new).  Bilateral lower extremity to knee level with decreased sensation on exam.  Suspect likely secondary to diabetic neuropathy, however will obtain CMP, CBC, TSH, B12 (patient on Metformin), and RPR to rule out underlying liver/renal abnormality, electrolyte derangements, vitamin/thyroid abnormalities.  Could consider EMG in the future.  Will send in gabapentin 100 mg nightly, can increase as needed based on tolerance.    Discussed case with  Dr. Gwendlyn Deutscher.  Recommended close follow-up (early next week) given symptomatology with language barrier impacting  assessment/evaluation.  ED precautions discussed including worsening dizziness/inability to walk, weakness, facial droop, speech/vision changes.  Martha Gillespie, [place]

## 2019-08-15 DIAGNOSIS — R42 Dizziness and giddiness: Secondary | ICD-10-CM | POA: Insufficient documentation

## 2019-08-15 HISTORY — DX: Dizziness and giddiness: R42

## 2019-08-15 LAB — CBC
Hematocrit: 42 % (ref 34.0–46.6)
Hemoglobin: 13.7 g/dL (ref 11.1–15.9)
MCH: 29.3 pg (ref 26.6–33.0)
MCHC: 32.6 g/dL (ref 31.5–35.7)
MCV: 90 fL (ref 79–97)
Platelets: 306 10*3/uL (ref 150–450)
RBC: 4.68 x10E6/uL (ref 3.77–5.28)
RDW: 13.9 % (ref 11.7–15.4)
WBC: 11.7 10*3/uL — ABNORMAL HIGH (ref 3.4–10.8)

## 2019-08-15 LAB — COMPREHENSIVE METABOLIC PANEL
ALT: 19 IU/L (ref 0–32)
AST: 19 IU/L (ref 0–40)
Albumin/Globulin Ratio: 1.8 (ref 1.2–2.2)
Albumin: 4.6 g/dL (ref 3.7–4.7)
Alkaline Phosphatase: 89 IU/L (ref 39–117)
BUN/Creatinine Ratio: 13 (ref 12–28)
BUN: 9 mg/dL (ref 8–27)
Bilirubin Total: 0.5 mg/dL (ref 0.0–1.2)
CO2: 24 mmol/L (ref 20–29)
Calcium: 9.6 mg/dL (ref 8.7–10.3)
Chloride: 101 mmol/L (ref 96–106)
Creatinine, Ser: 0.69 mg/dL (ref 0.57–1.00)
GFR calc Af Amer: 97 mL/min/{1.73_m2} (ref >59)
GFR calc non Af Amer: 84 mL/min/{1.73_m2} (ref >59)
Globulin, Total: 2.6 g/dL (ref 1.5–4.5)
Glucose: 137 mg/dL — ABNORMAL HIGH (ref 65–99)
Potassium: 5 mmol/L (ref 3.5–5.2)
Sodium: 140 mmol/L (ref 134–144)
Total Protein: 7.2 g/dL (ref 6.0–8.5)

## 2019-08-15 LAB — VITAMIN B12: Vitamin B-12: 297 pg/mL (ref 232–1245)

## 2019-08-15 LAB — RPR: RPR Ser Ql: NONREACTIVE

## 2019-08-15 LAB — TSH: TSH: 2.47 u[IU]/mL (ref 0.450–4.500)

## 2019-08-15 NOTE — Assessment & Plan Note (Addendum)
Reports 7-10 day history of episodic and continuous dizziness without postural association.  Unclear with broad differential, suspect likely multifactorial with possible viral etiology causing associated generalized fatigue (?Vestibular neuritis). Considered BPPV, Mnire's, labyrinthitis, however clinical presentation does not appear to match with this. Neurologically intact suggesting against CVA/central lesion.  Low concern for orthostatic hypotension or hypoglycemia, however could monitor BP/glucose when symptoms arise at home.  May use antiemetics OTC as needed, recommended close follow-up next week for further discussion/evaluation.  Could consider short course of prednisone if not improving, however will hold off for today.

## 2019-08-16 ENCOUNTER — Encounter: Payer: Self-pay | Admitting: Family Medicine

## 2019-08-16 NOTE — Assessment & Plan Note (Addendum)
Acute, possibly on chronic (previous charting of the symptoms however patient reports it is new).  Bilateral lower extremity to knee level with decreased sensation on exam.  Suspect likely secondary to diabetic neuropathy, however will obtain CMP, CBC, TSH, B12 (patient on Metformin), and RPR to rule out underlying liver/renal abnormality, electrolyte derangements, vitamin/thyroid abnormalities.  Could consider EMG in the future.  Will send in gabapentin 100 mg nightly, can increase as needed based on tolerance.

## 2019-08-19 ENCOUNTER — Ambulatory Visit (INDEPENDENT_AMBULATORY_CARE_PROVIDER_SITE_OTHER): Payer: Medicare Other | Admitting: Family Medicine

## 2019-08-19 ENCOUNTER — Encounter: Payer: Self-pay | Admitting: Family Medicine

## 2019-08-19 ENCOUNTER — Other Ambulatory Visit: Payer: Self-pay

## 2019-08-19 DIAGNOSIS — R202 Paresthesia of skin: Secondary | ICD-10-CM | POA: Diagnosis not present

## 2019-08-19 DIAGNOSIS — M792 Neuralgia and neuritis, unspecified: Secondary | ICD-10-CM | POA: Diagnosis not present

## 2019-08-19 DIAGNOSIS — R42 Dizziness and giddiness: Secondary | ICD-10-CM

## 2019-08-19 MED ORDER — GABAPENTIN 100 MG PO CAPS: 100.0000 mg | ORAL_CAPSULE | Freq: Every day | ORAL | 0 refills | Status: DC

## 2019-08-19 NOTE — Assessment & Plan Note (Signed)
Has completely resolved per patient report.  Has never had any neurologic deficits per charting.  Likely post viral or due to some self-limited cause for peripheral vertigo.  Will consider resolved.

## 2019-08-19 NOTE — Patient Instructions (Signed)
It was great meeting you today!  I am glad that things are going really well with the gabapentin.  This medication usually is really good for that burning sensation.  It is almost certainly due to the diabetes based on lab work that was done last time.  I sent in a 73-month supply of the gabapentin to help space out your pharmacy visits.  Lastly I am glad that the dizziness has improved a lot.  You can follow-up as needed for this issue.  ?? ??????? ????? ?????? ???? ??????! ???? ???? ? ?? gabapentin ?? ??? ?????? ???????? ?????? ????? ??????? ?? ?? ???? ?????????? ???? ????? ?????????? ???? ?????? ??? ?? ???? ??????? ????? ??????? ????? ?????? ???????? ?????? ?? ??? ??????? ??? ?????? ????? ???? ??????? ???????? ???????? ???? ?????? ????? ???? ?????????????? month-????? ????????? ?????? ???  ?????? ? ???? ?? ?? ??????? ???? ????? ????? ?? ????? ?? ???????? ???? ?????? ?????? ???? ??????????? ?ja tap?'?l?'? bh??na p?'um?d? khus? l?gy?! Mal?'? khu?? cha ki gabapentin k? s?tha c?jahar? v?stavamai r?mr? hum?dai ga'irah?k? cha. Y? au?adh? s?m?n'yatay? ty? ?g?k? utt?jan?k? l?gi r?mr? h?. Y? lagabhaga ni?cita r?pam? ly?bak? k?mam? ?dh?rita madhum?hak? k?ra?al? h? juna pachill? pa?aka gari'?k? thiy?. Mail? tap?'??k? ph?rm?s? bhrama?ahar? kh?l? ?h?'um?m? maddata garna gab?py?n?inak? month-mahin? ?p?rtim? pa?h?'?k? h?.  Antam? ma khus? chu ki cakkaral? dh?rai sudh?ra gar?k? cha. Tap?'?? yasa mudd?k? l?gi ?va?yaka anugamana garna saknuhuncha.

## 2019-08-19 NOTE — Assessment & Plan Note (Signed)
Apparently her symptoms have completely resolved with 100 mg nightly gabapentin.  Can always titrate up as necessary, will continue at this dose for now.  Can follow-up with PCP as needed for this issue.

## 2019-08-19 NOTE — Progress Notes (Signed)
  N[place]ali interpreter offered, patient opted to use her daughter to interpret  CHIEF COMPLAINT / HPI: 79 year old female who presents for follow-up.  She was seen on 08/14/2019 for neuropathic pain and dizziness.  For the neuropathic pain she has states of lab work done, but was ultimately chalked up to her diabetes.  She was started on gabapentin 100 mg nightly.  She states that since starting the gabapentin she is completely better.  Patient says that her dizziness is also completely improved and she is no longer feeling any dizzy symptoms.  Looks like this was chalked up to Mnire's or vestibular neuritis at the last appointment.  PERTINENT  PMH / PSH:    OBJECTIVE: BP 140/72   Pulse 87   Wt 138 lb 9.6 oz (62.9 kg)   SpO2 94%   BMI 27.99 kg/m   Gen: Very pleasant 79 year old female, no acute distress CV: Skin warm and dry, regular rate and rhythm, no M/R/G Resp: Lungs clear to auscultation bilaterally, no accessory muscle use Neuro: Alert and oriented, Speech clear, No gross deficits.  No nystagmus, CN II to XII intact no issue.  No gait abnormality Feet: Palpable PT/DP bilaterally.  No painful tenderness to palpation.  Sensation intact all distributions to light touch  ASSESSMENT / PLAN:  Nonspecific dizziness Has completely resolved per patient report.  Has never had any neurologic deficits per charting.  Likely post viral or due to some self-limited cause for peripheral vertigo.  Will consider resolved.  Neuropathic pain Apparently her symptoms have completely resolved with 100 mg nightly gabapentin.  Can always titrate up as necessary, will continue at this dose for now.  Can follow-up with PCP as needed for this issue.   Myrene Buddy MD PGY-3 Family Medicine Resident [facility]

## 2019-09-30 ENCOUNTER — Other Ambulatory Visit: Payer: Self-pay

## 2019-09-30 DIAGNOSIS — R202 Paresthesia of skin: Secondary | ICD-10-CM

## 2019-09-30 MED ORDER — GABAPENTIN 100 MG PO CAPS: 100.0000 mg | ORAL_CAPSULE | Freq: Every day | ORAL | 0 refills | Status: DC

## 2019-10-28 ENCOUNTER — Other Ambulatory Visit: Payer: Self-pay | Admitting: Family Medicine

## 2019-10-28 DIAGNOSIS — R202 Paresthesia of skin: Secondary | ICD-10-CM

## 2019-11-15 ENCOUNTER — Other Ambulatory Visit: Payer: Self-pay

## 2019-11-15 DIAGNOSIS — E785 Hyperlipidemia, unspecified: Secondary | ICD-10-CM

## 2019-11-15 NOTE — Telephone Encounter (Signed)
Pharmacy / patient  requesting 90 day supply. Pharmacy states pt currently receiving a 30 day supply. Sunday Spillers, CMA

## 2019-11-18 MED ORDER — ATORVASTATIN CALCIUM 40 MG PO TABS: 40.0000 mg | ORAL_TABLET | Freq: Every day | ORAL | 3 refills | Status: DC

## 2019-12-18 ENCOUNTER — Other Ambulatory Visit: Payer: Self-pay

## 2019-12-18 DIAGNOSIS — R202 Paresthesia of skin: Secondary | ICD-10-CM

## 2019-12-21 MED ORDER — GABAPENTIN 100 MG PO CAPS: ORAL_CAPSULE | ORAL | 0 refills | Status: DC

## 2019-12-25 ENCOUNTER — Other Ambulatory Visit: Payer: Self-pay

## 2019-12-25 DIAGNOSIS — E1142 Type 2 diabetes mellitus with diabetic polyneuropathy: Secondary | ICD-10-CM

## 2019-12-25 LAB — HM DIABETES EYE EXAM

## 2019-12-25 MED ORDER — METFORMIN HCL 1000 MG PO TABS: 1000.0000 mg | ORAL_TABLET | Freq: Two times a day (BID) | ORAL | 3 refills | Status: DC

## 2020-01-13 ENCOUNTER — Ambulatory Visit (INDEPENDENT_AMBULATORY_CARE_PROVIDER_SITE_OTHER): Payer: Medicare Other | Admitting: Family Medicine

## 2020-01-13 ENCOUNTER — Other Ambulatory Visit: Payer: Self-pay

## 2020-01-13 DIAGNOSIS — B86 Scabies: Secondary | ICD-10-CM | POA: Diagnosis present

## 2020-01-13 MED ORDER — PERMETHRIN 5 % EX CREA: 1.0000 "application " | TOPICAL_CREAM | Freq: Once | CUTANEOUS | 0 refills | Status: AC

## 2020-01-13 NOTE — Assessment & Plan Note (Signed)
Assessment: 1 week of "rash" which has been spreading since then and is pruritic without pain on palpation.  Patient does live with several family members but no one else has had similar rash.  Does also have a pet dog.  Has a history of scabies from several years ago.  Bites appear to have minor trailing consistent with scabies, though none is present between the webbing of the fingers and the toes.  Overall differential is mostly limited to some form of insect bite which can include flea, scabies, bedbugs, though with the excoriation in monitoring this seems most consistent with scabies.  Discussed with the patient the possibilities and the plan for treatment with permethrin 5%.  Plan to follow-up in 1 week if symptoms do not improve. Plan: -Permethrin 5% cream for 8 to 14 hours -Discussed with patient the importance of washing all her clothes and linens with a detergent -As there are multiple family members in the same household but no one else has had symptoms discussed with patient and patient's daughter that is important for them to follow-up with the primary care doctor if any of them develop symptoms -Follow-up in 1 week if symptoms do not improve or worsen.

## 2020-01-13 NOTE — Progress Notes (Signed)
    SUBJECTIVE:   CHIEF COMPLAINT / HPI:   Rash Patient is a 79 year old female presenting with a rash. Started a week ago. No new medications. Rash is non painful but itchy. Lives with several family members no one else with a rash. Does have a pet dog.  Patient with multiple individual "insect-bite like "lesions of the legs, torso.  No noted lesions between the fingers and toes.  Patient does have a history of scabies in the past.   Patient has a communication barrier. Interpreter was offered by nursing staff and physician. Patient persistently declined requesting that her daughter be her interpreter. No interpreter was used during encounter. Patient voiced understanding and agreement with plan above.    PERTINENT  PMH / PSH: History of scabies many years ago  OBJECTIVE:   BP (!) 158/70   Pulse 80   Ht 4\' 11"  (1.499 m)   Wt 135 lb 6 oz (61.4 kg)   SpO2 98%   BMI 27.34 kg/m    BP repeat 142/78.  General: NAD, pleasant, able to participate in exam Respiratory: No respiratory distress Skin: Multiple small erythematous bite-like lesions present on torso and legs with some excoriations and minor trailing.  None present between fingers and toes.         ASSESSMENT/PLAN:   Scabies Assessment: 1 week of "rash" which has been spreading since then and is pruritic without pain on palpation.  Patient does live with several family members but no one else has had similar rash.  Does also have a pet dog.  Has a history of scabies from several years ago.  Bites appear to have minor trailing consistent with scabies, though none is present between the webbing of the fingers and the toes.  Overall differential is mostly limited to some form of insect bite which can include flea, scabies, bedbugs, though with the excoriation in monitoring this seems most consistent with scabies.  Discussed with the patient the possibilities and the plan for treatment with permethrin 5%.  Plan to follow-up in 1  week if symptoms do not improve. Plan: -Permethrin 5% cream for 8 to 14 hours -Discussed with patient the importance of washing all her clothes and linens with a detergent -As there are multiple family members in the same household but no one else has had symptoms discussed with patient and patient's daughter that is important for them to follow-up with the primary care doctor if any of them develop symptoms -Follow-up in 1 week if symptoms do not improve or worsen.    , DO [facility]

## 2020-01-13 NOTE — Patient Instructions (Addendum)
It was great to see you!  Our plans for today:  -I am prescribing a cream to use for treating scabies as I think this is the most likely cause of your symptoms.  Apply this cream to your body, particularly the areas with the bites and leave it for 8 to 14 hours.  I recommend putting it on right before bed and then washing it off about 10 to 14 hours later.  It is important to also wash all of your clothing and linens at this time as there can be scabies on these. -If anyone else in the household gets similar bites it is important for them to see their doctor to get a similar treatment. -If the symptoms do not improve in 1 week I would like for you to make a follow-up appointment because this would suggest it may be something other than scabies causing these bites.  Take care and seek immediate care sooner if you develop any concerns.   Dr. [facility]  Scabies, Adult  Scabies is a skin condition that happens when very small insects get under the skin (infestation). This causes a rash and severe itchiness. Scabies can spread from person to person (is contagious). If you get scabies, it is common for others in your household to get scabies too. With proper treatment, symptoms usually go away in 2-4 weeks. Scabies usually does not cause lasting problems. What are the causes? This condition is caused by tiny mites (Sarcoptes scabiei, or human itch mites) that can only be seen with a microscope. The mites get into the top layer of skin and lay eggs. Scabies can spread from person to person through:  Close contact with a person who has scabies.  Sharing or having contact with infested items, such as towels, bedding, or clothing. What increases the risk? The following factors may make you more likely to develop this condition:  Living in a nursing home or other extended care facility.  Having sexual contact with a partner who has scabies.  Caring for others who are at increased  risk for scabies. What are the signs or symptoms? Symptoms of this condition include:  Severe itchiness. This is often worse at night.  A rash that includes tiny red bumps or blisters. The rash commonly occurs on the hands, wrists, elbows, armpits, chest, waist, groin, or buttocks. The bumps may form a line (burrow) in some areas.  Skin irritation. This can include scaly patches or sores. How is this diagnosed? This condition may be diagnosed based on:  A physical exam of the skin.  A skin test. Your health care provider may take a sample of your affected skin (skin scraping) and have it examined under a microscope for signs of mites. How is this treated? This condition may be treated with:  Medicated cream or lotion that kills the mites. This is spread on the entire body and left on for several hours. Usually, one treatment with medicated cream or lotion is enough to kill all the mites. In severe cases, the treatment may need to be repeated.  Medicated cream that relieves itching.  Medicines taken by mouth (orally) that: ? Relieve itching. ? Reduce the swelling and redness. ? Kill the mites. This treatment may be done in severe cases. Follow these instructions at home: Medicines   Take or apply over-the-counter and prescription medicines as told by your health care provider.  Apply medicated cream or lotion as told by your health care provider.  Do  not wash off the medicated cream or lotion until the necessary amount of time has passed. Skin care   Avoid scratching the affected areas of your skin.  Keep your fingernails closely trimmed to reduce injury from scratching.  Take cool baths or apply cool washcloths to your skin to help reduce itching. General instructions  Clean all items that you recently had contact with, including bedding, clothing, and furniture. Do this on the same day that you start treatment. ? Dry clean items, or use hot water to wash items. Dry  items on the hot dry cycle. ? Place items that cannot be washed into closed, airtight plastic bags for at least 3 days. The mites cannot live for more than 3 days away from human skin. ? Vacuum furniture and mattresses that you use.  Make sure that other people who may have been infested are examined by a health care provider. These include members of your household and anyone who may have had contact with infested items.  Keep all follow-up visits as told by your health care provider. This is important. Contact a health care provider if:  You have itching that does not go away after 4 weeks of treatment.  You continue to develop new bumps or burrows.  You have redness, swelling, or pain in your rash area after treatment.  You have fluid, blood, or pus coming from your rash. Summary  Scabies is a skin condition that causes a rash and severe itchiness.  This condition is caused by tiny mites that get into the top layer of the skin and lay eggs.  Scabies can spread from person to person.  Follow treatments as recommended by your health care provider.  Clean all items that you recently had contact with. This information is not intended to replace advice given to you by your health care provider. Make sure you discuss any questions you have with your health care provider. Document Revised: 03/07/2018 Document Reviewed: 03/07/2018 Elsevier Patient Education  2020 [employer].

## 2020-01-14 ENCOUNTER — Other Ambulatory Visit: Payer: Self-pay | Admitting: *Deleted

## 2020-01-15 MED ORDER — CETIRIZINE HCL 5 MG PO TABS
5.0000 mg | ORAL_TABLET | Freq: Every day | ORAL | 3 refills | Status: DC
Start: 2020-01-15 — End: 2021-01-01

## 2020-01-24 ENCOUNTER — Ambulatory Visit: Payer: Medicare Other | Admitting: Family Medicine

## 2020-03-15 ENCOUNTER — Other Ambulatory Visit: Payer: Self-pay | Admitting: Family Medicine

## 2020-03-15 DIAGNOSIS — R202 Paresthesia of skin: Secondary | ICD-10-CM

## 2020-04-17 ENCOUNTER — Other Ambulatory Visit: Payer: Self-pay

## 2020-04-17 ENCOUNTER — Ambulatory Visit (INDEPENDENT_AMBULATORY_CARE_PROVIDER_SITE_OTHER): Payer: Medicare Other | Admitting: Family Medicine

## 2020-04-17 ENCOUNTER — Encounter: Payer: Self-pay | Admitting: Family Medicine

## 2020-04-17 VITALS — BP 108/56 | HR 89 | Ht 59.0 in | Wt 128.4 lb

## 2020-04-17 DIAGNOSIS — Z23 Encounter for immunization: Secondary | ICD-10-CM

## 2020-04-17 DIAGNOSIS — E1142 Type 2 diabetes mellitus with diabetic polyneuropathy: Secondary | ICD-10-CM

## 2020-04-17 DIAGNOSIS — I10 Essential (primary) hypertension: Secondary | ICD-10-CM

## 2020-04-17 DIAGNOSIS — Z139 Encounter for screening, unspecified: Secondary | ICD-10-CM

## 2020-04-17 LAB — POCT GLYCOSYLATED HEMOGLOBIN (HGB A1C): Hemoglobin A1C: 6 % — AB (ref 4.0–5.6)

## 2020-04-17 NOTE — Progress Notes (Signed)
    SUBJECTIVE:   CHIEF COMPLAINT / HPI: diabetic and BP check up  History obtained using daughter as interpreter. No acute concerns today. Requesting Flu vaccine Foot exam: Will complete today A1C: 6.0 Symptoms: No symptoms of hypoglycemia. Denies symptoms of  polyuria, polydipsia. Endorses numbness in extremities, and without foot ulcers/trauma Meds: Metformin 1000 mg BID  Monitoring Labs and Parameters Last A1C:  Lab Results  Component Value Date   HGBA1C 6.0 (A) 04/17/2020   Last Lipid:     Component Value Date/Time   CHOL 113 04/17/2020 1648   HDL 39 (L) 04/17/2020 1648   Last Bmet  Potassium  Date Value Ref Range Status  04/17/2020 4.4 3.5 - 5.2 mmol/L Final   Sodium  Date Value Ref Range Status  04/17/2020 143 134 - 144 mmol/L Final   Creat  Date Value Ref Range Status  06/20/2016 0.74 0.60 - 0.93 mg/dL Final    Comment:      For patients > or = 77 years of age: The upper reference limit for Creatinine is approximately 13% higher for people identified as African-American.      Creatinine, Ser  Date Value Ref Range Status  04/17/2020 0.64 0.57 - 1.00 mg/dL Final      Hypertension  Patient here for follow-up of controlled arterial hypertension. Blood pressure is well controlled at home. He/She is not exercising and is adherent to a low-salt diet. She currently takes Lisinopril 5 mg and is adherent to regimen.  Cardiac symptoms: none. Patient denies: chest pain, chest pressure/discomfort, dyspnea, fatigue, lower extremity edema, orthopnea and palpitations. Cardiovascular risk factors: advanced age (older than 70 for men, 86 for women), diabetes mellitus, dyslipidemia, hypertension and sedentary lifestyle. Use of agents associated with hypertension: none. History of target organ damage: none.  PERTINENT  PMH / PSH:  DM Type 2 HTN   OBJECTIVE:   BP (!) 108/56   Pulse 89   Ht 4\' 11"  (1.499 m)   Wt 128 lb 6.4 oz (58.2 kg)   SpO2 92%   BMI 25.93 kg/m     General: Alert and oriented, no apparent distress  Cardiovascular: RRR with no murmurs noted Respiratory: CTA bilaterally  Gastrointestinal: Bowel sounds present. No abdominal pain  Diabetic Foot Exam - Simple   Simple Foot Form Visual Inspection No deformities, no ulcerations, no other skin breakdown bilaterally: Yes Sensation Testing See comments: Yes Pulse Check Posterior Tibialis and Dorsalis pulse intact bilaterally: Yes Comments Decreased sensation bilaterally      ASSESSMENT/PLAN:   Diabetes mellitus Asymptomatic. No hypoglycemic events.  Does not check sugars at home.   -Continue current medications -A1c 6.0 today, repeat in 1 year -Bmet and Lipid panel today -Follow up in 6 months  HTN (hypertension) Normotensive. Diastolic low at 56.  Currently on Lisinopril 5 mg daily. Asymptomatic. Continue current medication. Will continue to montior, if becomes symptomatic can consider lowering medication or discontinue -Follow up in 6 months or sooner if needed     , MD [facility]

## 2020-04-17 NOTE — Patient Instructions (Signed)
It was nice meeting you today!  I will call with the results of your lab work  You should pay attention to your hemoglobin A1C.  It is a three month test about your average blood sugar. If the A1C is - <7.0 is great.  That is our goal for treating you. - Between 7.0 and 9.0 is not so good.  We would need to work to do better. - Above 9.0 is terrible.  You would really need to work with [place] to get it under control.    Today's A1C = 6.0  Continue your current medication  Please pick up your Gabapentin at the pharmacy.  I refilled it on 03/16/20.  If you need a new prescription please let me know  If you have any questions or concerns, please feel free to call the clinic.   Be well,  Dana Allan, MD [facility]

## 2020-04-18 LAB — HCV INTERPRETATION

## 2020-04-18 LAB — BASIC METABOLIC PANEL
BUN/Creatinine Ratio: 23 (ref 12–28)
BUN: 15 mg/dL (ref 8–27)
CO2: 25 mmol/L (ref 20–29)
Calcium: 9.3 mg/dL (ref 8.7–10.3)
Chloride: 103 mmol/L (ref 96–106)
Creatinine, Ser: 0.64 mg/dL (ref 0.57–1.00)
GFR calc Af Amer: 100 mL/min/{1.73_m2} (ref >59)
GFR calc non Af Amer: 86 mL/min/{1.73_m2} (ref >59)
Glucose: 118 mg/dL — ABNORMAL HIGH (ref 65–99)
Potassium: 4.4 mmol/L (ref 3.5–5.2)
Sodium: 143 mmol/L (ref 134–144)

## 2020-04-18 LAB — HCV AB W REFLEX TO QUANT PCR: HCV Ab: <0.1 s/co ratio (ref 0.0–0.9)

## 2020-04-18 LAB — LIPID PANEL
Chol/HDL Ratio: 2.9 ratio (ref 0.0–4.4)
Cholesterol, Total: 113 mg/dL (ref 100–199)
HDL: 39 mg/dL — ABNORMAL LOW (ref >39)
LDL Chol Calc (NIH): 50 mg/dL (ref 0–99)
Triglycerides: 140 mg/dL (ref 0–149)
VLDL Cholesterol Cal: 24 mg/dL (ref 5–40)

## 2020-04-19 ENCOUNTER — Encounter: Payer: Self-pay | Admitting: Family Medicine

## 2020-04-19 NOTE — Assessment & Plan Note (Signed)
Normotensive. Diastolic low at 56.  Currently on Lisinopril 5 mg daily. Asymptomatic. Continue current medication. Will continue to montior, if becomes symptomatic can consider lowering medication or discontinue -Follow up in 6 months or sooner if needed

## 2020-04-19 NOTE — Assessment & Plan Note (Signed)
Asymptomatic. No hypoglycemic events.  Does not check sugars at home.   -Continue current medications -A1c 6.0 today, repeat in 1 year -Bmet and Lipid panel today -Follow up in 6 months

## 2020-06-03 ENCOUNTER — Other Ambulatory Visit: Payer: Self-pay

## 2020-06-03 ENCOUNTER — Ambulatory Visit (INDEPENDENT_AMBULATORY_CARE_PROVIDER_SITE_OTHER): Payer: Medicare Other | Admitting: Family Medicine

## 2020-06-03 ENCOUNTER — Encounter: Payer: Self-pay | Admitting: Family Medicine

## 2020-06-03 VITALS — BP 132/76 | HR 96 | Wt 126.0 lb

## 2020-06-03 DIAGNOSIS — R634 Abnormal weight loss: Secondary | ICD-10-CM | POA: Insufficient documentation

## 2020-06-03 DIAGNOSIS — G8929 Other chronic pain: Secondary | ICD-10-CM | POA: Diagnosis not present

## 2020-06-03 DIAGNOSIS — R42 Dizziness and giddiness: Secondary | ICD-10-CM

## 2020-06-03 DIAGNOSIS — R519 Headache, unspecified: Secondary | ICD-10-CM

## 2020-06-03 HISTORY — DX: Abnormal weight loss: R63.4

## 2020-06-03 NOTE — Assessment & Plan Note (Signed)
Roughly 20 pound weight loss over the past few years.  Upon further discussion with the daughter in law, she states that the poor appetite and weight loss started after the daughter-in-law's mother died.  It appears they were friends and patient decreased appetite may be manifestation of depression.  Scheduled a follow-up appointment with her PCP in a few weeks so they can discuss treatment options.

## 2020-06-03 NOTE — Patient Instructions (Signed)
It was nice to see you today,   We did the following:   - we are going to order your CT of your head.  Someone will call you when it is time to schedule it.    - we are stopping your lisinopril  - we are having you follow up with Dr. Clent Ridges in a few weeks.    - you can take 1000mg  of tylenol twice a day or 500mg  of tylenol 4 times a day.   Have a great day,  , MD

## 2020-06-03 NOTE — Assessment & Plan Note (Signed)
Headache ongoing for the past 1 to 2 weeks that is top of the head and all day long with an associated vision change and nausea.  Given the vision changes and nausea there is concern for increased intracranial pressure or mass.  We will get CT head noncontrast.  Advised patient she can increase her Tylenol dose.  Close follow-up with PCP in the next 2 to 3 weeks

## 2020-06-03 NOTE — Assessment & Plan Note (Signed)
Appears to have had this issue previously that self resolved.  This time is associated with the headaches in regards to onset.  Symptoms most consistent with orthostasis.  Previous blood pressures have been low, especially diastolic.  Today's was normal range.  Will stop lisinopril and reevaluate in a few weeks

## 2020-06-03 NOTE — Progress Notes (Signed)
° ° °  SUBJECTIVE:   CHIEF COMPLAINT / HPI:    Headaches: Ongoing for the past 1 to 2 weeks.  Pain is on the top of her head.  Happens all day long.  Takes tylenol 500 mg for the headaches which helps somewhat.  She gets chills after the headaches.  Headache improves after she sleeps. She also endorses blurred vision. Headaches causing nausea.  She hash vomited twice.  Never had issues with headaches when she was younger.    Dizziness: feels like she is going to fall. Dizziness is most notable when she gets up in the morning. She does not feel like the room is spinning.   She has hearing issues at baseline.  No change in hearing from baseline.  Taking medications as prescribed.  Weight loss: Patient is not eating as much as she used to.  She has had weight loss over the past few years.  Patient's daughter-in-law states that her weight loss and decreased appetite started after the daughter-in-law's biological mother died.  The 3 of them live together prior to this.   PERTINENT  PMH / PSH: Diabetes, hypertension  OBJECTIVE:   BP 132/76    Pulse 96    Wt 126 lb (57.2 kg)    SpO2 93%    BMI 25.45 kg/m   General: Alert, frail appearing elderly woman.  Accompanied by daughter-in-law who is doing the translating.  Wearing traditional [place]alese attire HEENT: PERRLA, EOMI.  Multiple nose piercings. CV: Regular rate and rhythm Pulmonary: Lungs clear to auscultation bilaterally Neuro: Cranial nerves II through XII grossly intact.  Finger-nose test normal.  Negative head impulse testing  ASSESSMENT/PLAN:   Chronic nonintractable headache Headache ongoing for the past 1 to 2 weeks that is top of the head and all day long with an associated vision change and nausea.  Given the vision changes and nausea there is concern for increased intracranial pressure or mass.  We will get CT head noncontrast.  Advised patient she can increase her Tylenol dose.  Close follow-up with PCP in the next 2 to 3  weeks  Dizziness Appears to have had this issue previously that self resolved.  This time is associated with the headaches in regards to onset.  Symptoms most consistent with orthostasis.  Previous blood pressures have been low, especially diastolic.  Today's was normal range.  Will stop lisinopril and reevaluate in a few weeks  Weight loss Roughly 20 pound weight loss over the past few years.  Upon further discussion with the daughter in law, she states that the poor appetite and weight loss started after the daughter-in-law's mother died.  It appears they were friends and patient decreased appetite may be manifestation of depression.  Scheduled a follow-up appointment with her PCP in a few weeks so they can discuss treatment options.     Sandre Kitty, MD [facility]

## 2020-06-18 NOTE — Progress Notes (Deleted)
    SUBJECTIVE:   CHIEF COMPLAINT / HPI:   Presents for follow up for chronic intractable headache. Seen in clinic on 01/19 and treated with Tylenol.  CT head 02/04 shows*** Since then patient reports improvement in symptoms **. Associated symptoms include **.   Depressed Mood PHQ9***  Weight loss 20lbs in last year since death of family member. Open to trying medication***  Has no history of mental health.    PERTINENT  PMH / PSH: ***  OBJECTIVE:   There were no vitals taken for this visit.   General: Alert, no acute distress Cardio: Normal S1 and S2, RRR, no r/m/g Pulm: CTAB, normal work of breathing Abdomen: Bowel sounds normal. Abdomen soft and non-tender.  Extremities: No peripheral edema.  Neuro: Cranial nerves grossly intact   ASSESSMENT/PLAN:   No problem-specific Assessment & Plan notes found for this encounter.     Dana Allan, MD [facility]

## 2020-06-19 ENCOUNTER — Other Ambulatory Visit: Payer: Medicare Other

## 2020-06-22 ENCOUNTER — Ambulatory Visit: Payer: Medicare Other | Admitting: Family Medicine

## 2020-06-22 ENCOUNTER — Ambulatory Visit
Admission: RE | Admit: 2020-06-22 | Discharge: 2020-06-22 | Disposition: A | Discharge status: Home / Self Care | Payer: Medicare Other | Source: Ambulatory Visit | Attending: Family Medicine | Admitting: Family Medicine

## 2020-06-22 DIAGNOSIS — G8929 Other chronic pain: Secondary | ICD-10-CM

## 2020-06-22 DIAGNOSIS — R519 Headache, unspecified: Secondary | ICD-10-CM

## 2020-07-14 ENCOUNTER — Other Ambulatory Visit: Payer: Self-pay

## 2020-07-14 ENCOUNTER — Encounter: Payer: Self-pay | Admitting: Family Medicine

## 2020-07-14 ENCOUNTER — Ambulatory Visit (INDEPENDENT_AMBULATORY_CARE_PROVIDER_SITE_OTHER): Payer: Medicare Other | Admitting: Family Medicine

## 2020-07-14 VITALS — BP 143/79 | HR 92 | Ht 63.0 in | Wt 127.4 lb

## 2020-07-14 DIAGNOSIS — R1030 Lower abdominal pain, unspecified: Secondary | ICD-10-CM | POA: Diagnosis not present

## 2020-07-14 DIAGNOSIS — R319 Hematuria, unspecified: Secondary | ICD-10-CM

## 2020-07-14 LAB — POCT URINALYSIS DIP (CLINITEK)
Bilirubin, UA: NEGATIVE
Glucose, UA: NEGATIVE mg/dL
Ketones, POC UA: NEGATIVE mg/dL
Nitrite, UA: NEGATIVE
POC PROTEIN,UA: NEGATIVE
Spec Grav, UA: 1.015 (ref 1.010–1.025)
Urobilinogen, UA: 0.2 E.U./dL
pH, UA: 7.5 (ref 5.0–8.0)

## 2020-07-14 LAB — POCT UA - MICROSCOPIC ONLY

## 2020-07-14 MED ORDER — CEPHALEXIN 500 MG PO CAPS: 500.0000 mg | ORAL_CAPSULE | Freq: Four times a day (QID) | ORAL | 0 refills | Status: AC

## 2020-07-14 MED ORDER — POLYETHYLENE GLYCOL 3350 17 GM/SCOOP PO POWD: 17.0000 g | Freq: Two times a day (BID) | ORAL | 1 refills | Status: DC | PRN

## 2020-07-14 NOTE — Progress Notes (Signed)
    SUBJECTIVE:   CHIEF COMPLAINT / HPI:  Abdominal pain and f/u for headache  History obtained by Nepali interpreter using iPad  Abdominal pain Patient reports lower abdominal pain for about 2 weeks.  Sometimes pain shifts to left side.  Reports fever and chills about 2 to 3 weeks ago.  Endorses burning all over her body.  Also reports urgency to void and dysuria.  Denies any back pain, vaginal discharge or hematuria.  She does report constipation and straining.  Last bowel movement was this morning.  Moves bowels every 2 to 3 days.  Headache Was seen in clinic for headaches CT head was ordered.  Patient is requesting results.  I have reviewed the results and they are negative for any acute abnormalities.  She continues to have intermittent headaches that is relieved with Tylenol but not today.  PERTINENT  PMH / PSH:  Hypertension Diabetes Memory problems  Chronic headaches   OBJECTIVE:   BP (!) 143/79   Pulse 92   Ht 5\' 3"  (1.6 m)   Wt 127 lb 6.4 oz (57.8 kg)   SpO2 92%   BMI 22.57 kg/m    General: Alert, no acute distress Cardio: Normal S1 and S2, RRR, no r/m/g Pulm: CTAB, normal work of breathing Abdomen: Bowel sounds normal. Abdomen soft and notable for suprapubic tenderness.  No CVA tenderness.  Extremities: No peripheral edema.  Neuro: Cranial nerves grossly intact   ASSESSMENT/PLAN:   Abdominal pain Symptoms likely secondary to UTI given suprapubic pain, urgency and dysuria.  Less likely pyelonephritis given no fevers or CVA tenderness.  Constipation could also be contributing to lower abdominal pain given that she is straining and moving bowels every 2 to 3 days. -Urine positive for large amount of leukocytes and blood -We will send urine for culture -Start Keflex 500 mg 4 times daily x5/7  -We will repeat urine after treatment given blood noted in urine. -Start MiraLAX 1 scoop twice daily  -Follow-up in 1 week with PCP or sooner if symptoms worsen. -Strict  return precautions provided      , MD [facility]

## 2020-07-16 LAB — URINE CULTURE

## 2020-07-17 ENCOUNTER — Other Ambulatory Visit: Payer: Self-pay

## 2020-07-17 ENCOUNTER — Other Ambulatory Visit: Payer: Medicare Other

## 2020-07-18 ENCOUNTER — Telehealth: Payer: Self-pay | Admitting: Family Medicine

## 2020-07-18 DIAGNOSIS — R1030 Lower abdominal pain, unspecified: Secondary | ICD-10-CM

## 2020-07-18 LAB — COMPREHENSIVE METABOLIC PANEL
ALT: 15 IU/L (ref 0–32)
AST: 17 IU/L (ref 0–40)
Albumin/Globulin Ratio: 1.6 (ref 1.2–2.2)
Albumin: 4.2 g/dL (ref 3.7–4.7)
Alkaline Phosphatase: 68 IU/L (ref 44–121)
BUN/Creatinine Ratio: 19 (ref 12–28)
BUN: 12 mg/dL (ref 8–27)
Bilirubin Total: 0.3 mg/dL (ref 0.0–1.2)
CO2: 23 mmol/L (ref 20–29)
Calcium: 9.4 mg/dL (ref 8.7–10.3)
Chloride: 102 mmol/L (ref 96–106)
Creatinine, Ser: 0.64 mg/dL (ref 0.57–1.00)
Globulin, Total: 2.7 g/dL (ref 1.5–4.5)
Glucose: 131 mg/dL — ABNORMAL HIGH (ref 65–99)
Potassium: 4.6 mmol/L (ref 3.5–5.2)
Sodium: 142 mmol/L (ref 134–144)
Total Protein: 6.9 g/dL (ref 6.0–8.5)
eGFR: 90 mL/min/{1.73_m2} (ref >59)

## 2020-07-18 LAB — CBC WITH DIFFERENTIAL/PLATELET
Basophils Absolute: 0.1 10*3/uL (ref 0.0–0.2)
Basos: 1 %
EOS (ABSOLUTE): 0.1 10*3/uL (ref 0.0–0.4)
Eos: 1 %
Hematocrit: 38.5 % (ref 34.0–46.6)
Hemoglobin: 12.6 g/dL (ref 11.1–15.9)
Immature Grans (Abs): 0 10*3/uL (ref 0.0–0.1)
Immature Granulocytes: 0 %
Lymphocytes Absolute: 2.3 10*3/uL (ref 0.7–3.1)
Lymphs: 20 %
MCH: 28.3 pg (ref 26.6–33.0)
MCHC: 32.7 g/dL (ref 31.5–35.7)
MCV: 86 fL (ref 79–97)
Monocytes Absolute: 0.8 10*3/uL (ref 0.1–0.9)
Monocytes: 7 %
Neutrophils Absolute: 8.2 10*3/uL — ABNORMAL HIGH (ref 1.4–7.0)
Neutrophils: 71 %
Platelets: 306 10*3/uL (ref 150–450)
RBC: 4.46 x10E6/uL (ref 3.77–5.28)
RDW: 14.4 % (ref 11.7–15.4)
WBC: 11.5 10*3/uL — ABNORMAL HIGH (ref 3.4–10.8)

## 2020-07-18 NOTE — Assessment & Plan Note (Signed)
Symptoms likely secondary to UTI given suprapubic pain, urgency and dysuria.  Less likely pyelonephritis given no fevers or CVA tenderness.  Constipation could also be contributing to lower abdominal pain given that she is straining and moving bowels every 2 to 3 days. -Urine positive for large amount of leukocytes and blood -We will send urine for culture -Start Keflex 500 mg 4 times daily x5/7  -We will repeat urine after treatment given blood noted in urine. -Start MiraLAX 1 scoop twice daily  -Follow-up in 1 week with PCP or sooner if symptoms worsen. -Strict return precautions provided

## 2020-07-18 NOTE — Telephone Encounter (Signed)
Initially attempted to use Nepali interpreter through [employer] but was unsuccessful.  Called patient to discuss results of urine test.  Spoke with Ambika,daughter in law who speaks [place].  She reports that Martha Gillespie is doing much better since starting Antibiotics.  I discussed that urine culture was negative for bacteria and that we could stop antibiotics now but given that she is feeling better and also had some blood in urine I would like to continue full course. I advised her to make a lab appointment 1 week after completion of antibiotics for repeat urine.  She understood and is agreeable to plan.  I previously precepted with Dr Miquel Dunn  Dana Allan, MD [facility]

## 2020-08-03 ENCOUNTER — Other Ambulatory Visit: Payer: Self-pay | Admitting: *Deleted

## 2020-08-03 DIAGNOSIS — M810 Age-related osteoporosis without current pathological fracture: Secondary | ICD-10-CM

## 2020-08-03 MED ORDER — ALENDRONATE SODIUM 70 MG PO TABS: ORAL_TABLET | ORAL | 11 refills | Status: DC

## 2020-09-11 ENCOUNTER — Other Ambulatory Visit: Payer: Self-pay

## 2020-09-11 DIAGNOSIS — E785 Hyperlipidemia, unspecified: Secondary | ICD-10-CM

## 2020-09-11 MED ORDER — ATORVASTATIN CALCIUM 40 MG PO TABS: 40.0000 mg | ORAL_TABLET | Freq: Every day | ORAL | 3 refills | Status: DC

## 2020-10-02 ENCOUNTER — Other Ambulatory Visit: Payer: Self-pay

## 2020-10-02 DIAGNOSIS — E1142 Type 2 diabetes mellitus with diabetic polyneuropathy: Secondary | ICD-10-CM

## 2020-10-02 MED ORDER — METFORMIN HCL 1000 MG PO TABS: 1000.0000 mg | ORAL_TABLET | Freq: Two times a day (BID) | ORAL | 3 refills | Status: DC

## 2020-12-27 NOTE — Progress Notes (Signed)
SUBJECTIVE:   CHIEF COMPLAINT / HPI: diabetes and blood pressure check  Due to language barrier, an interpreter was offered but patient and family refused so Granddaughter and son who spoke English acted as Equities trader and were present during the history-taking and subsequent discussion (and for part of the physical exam) with this patient.   Family reports that patient has been without medications for about 1 month.  There was come confusion with the previous pharmacy and they have now changed pharmacies which has been updated in Epic.  Diabetes Does not check CBG at home.  Denies any chest pain, polyuria, polydipsia, abdominal pain,changes in mental state or visual changes.  Home medications Metformin 1000 mg BID, Atorvastatin 40 mg daily but has been without medication for 1 month.  HTN Denies any dizziness, falls, weakness, headaches, chest pain, shortness of breath, visual disturbances.  Home medications Lisinopril 5 mg daily but without medications for 1 month  Neuropathy Continues to have burning sensation on bottom of feet.  Denies any decrease in sensation, weakness, numbness or tingling.  Denies any wounds or ulcers.  Was previously prescribed Gabapentin but patient reports she has not been taking this as she did not know what this was for.  She is wanting to retry again to see if it will help  PERTINENT  PMH / PSH:  DM Type 2 HTN HLD Neuropathy  OBJECTIVE:   BP 126/64   Pulse 95   Ht 5\' 3"  (1.6 m)   Wt 125 lb 4 oz (56.8 kg)   SpO2 96%   BMI 22.19 kg/m    General: Alert, no acute distress Cardio: Normal S1 and S2, RRR, no r/m/g Pulm: CTAB, normal work of breathing Abdomen: Bowel sounds normal. Abdomen soft and non-tender.  Extremities: No peripheral edema.  Neuro: Cranial nerves grossly intact Diabetic foot exam: No deformities, ulcerations, or other skin breakdown on feet bilaterally.  Sensation intact to monofilament and light touch.  PT and DP pulses intact  BL.     ASSESSMENT/PLAN:   Diabetes mellitus Asymptomatic.  A1c 5.9 today.  Has been off Metformin for 1 month -Restart Metformin at lower dose.  500 mg daily -Repeat A1c in 3 months, if continues to remain low will discontinue Metformin -Will check Vit B12 at next visit -Foot Exam today -Encouraged yearly eye exam -Encouraged Shingrix, sent prescription -Follow up in 3 months  HTN (hypertension) Normotensive and at goal <120/80.  Has not been taking medication for 1 months -Will restart Lisinopril at lower dose 2.5 mg daily -BMet today -Repeat BMet in 1 week -Will continue to monitor, she had low diastolic pressure previously and may need discontinue medications -Monitor BP at home and notify MD if SBP<100 or symptomatic -Follow up with lab results -Follow up 2-3 weeks or sooner if needed   Hyperlipidemia Last LDL 50 (12/21), goal <70. Has been without medication for 1 month -Lipid panel today -Continue Atorvastatin 40 mg daily -Repeat in 6 months  Weight loss Per chart review, appears to have lost 10 lbs since August 2021. In Jan 2022 there was concern for a 20lbs weight loss by family after the loss of family member and was advised to follow up with PCP.  Unfortunately this was not addressed at subsequent visits.  I do not see a recent colonoscopy on file. Last mammogram 2018 negative PAP no documentation, patient was > 65 when established care Colonoscopy, no documentation of completion.  Patient was given number to schedule back in 2018.  I think patient would benefit from a Geriatric clinic visit Follow up appointment scheduled in sept with PCP.  Will further evaluate weight loss.      Dana Allan, MD [facility]

## 2020-12-27 NOTE — Patient Instructions (Addendum)
Thank you for coming to see me today. It was a pleasure.   You are due for an eye exam.  Please make sure that you call your eye doctor to have this scheduled and have them fax the results to our office.   We will get some labs today.  If they are abnormal or we need to do something about them, I will call you.  If they are normal, I will send you a message on MyChart (if it is active) or a letter in the mail.  If you don't hear from [place] in 2 weeks, please call the office at the number below.   You should pay attention to your hemoglobin A1C.  It is a three month test about your average blood sugar. If the A1C is - <7.0 is great.  That is our goal for treating you. - Between 7.0 and 9.0 is not so good.  We would need to work to do better. - Above 9.0 is terrible.  You would really need to work with [place] to get it under control.    Today's A1C = 5.9   I recommend you talk to your pharmacist about receiving the Shingles vaccine  You are due for an eye exam.  Please make sure that you call your eye doctor to have this scheduled and have them fax the results to our office.   Decrease your Metformin to 500 mg once a day Decrease your Lisinopril to 2.5 mg daily  If your blood pressure is less that 100 or you have any dizziness/ weakness call your doctor or go to Urgent care  Take Gabapentin 100 mg at night for burning sensation in your foot.  Please follow-up in 1 week for blood work.  Follow up with PCP in 2-3 weeks or sooner if needed.  If you have any questions or concerns, please do not hesitate to call the office at [phone].  Best,   Dana Allan, MD

## 2020-12-30 ENCOUNTER — Other Ambulatory Visit: Payer: Self-pay

## 2020-12-30 ENCOUNTER — Ambulatory Visit (INDEPENDENT_AMBULATORY_CARE_PROVIDER_SITE_OTHER): Payer: Medicare Other | Admitting: Family Medicine

## 2020-12-30 ENCOUNTER — Encounter: Payer: Self-pay | Admitting: Family Medicine

## 2020-12-30 VITALS — BP 126/64 | HR 95 | Ht 63.0 in | Wt 125.2 lb

## 2020-12-30 DIAGNOSIS — E1169 Type 2 diabetes mellitus with other specified complication: Secondary | ICD-10-CM

## 2020-12-30 DIAGNOSIS — M810 Age-related osteoporosis without current pathological fracture: Secondary | ICD-10-CM | POA: Diagnosis not present

## 2020-12-30 DIAGNOSIS — E440 Moderate protein-calorie malnutrition: Secondary | ICD-10-CM | POA: Diagnosis not present

## 2020-12-30 DIAGNOSIS — I10 Essential (primary) hypertension: Secondary | ICD-10-CM

## 2020-12-30 DIAGNOSIS — E1142 Type 2 diabetes mellitus with diabetic polyneuropathy: Secondary | ICD-10-CM | POA: Diagnosis not present

## 2020-12-30 DIAGNOSIS — E785 Hyperlipidemia, unspecified: Secondary | ICD-10-CM

## 2020-12-30 DIAGNOSIS — R634 Abnormal weight loss: Secondary | ICD-10-CM

## 2020-12-30 DIAGNOSIS — R202 Paresthesia of skin: Secondary | ICD-10-CM | POA: Diagnosis not present

## 2020-12-30 LAB — POCT GLYCOSYLATED HEMOGLOBIN (HGB A1C): HbA1c, POC (controlled diabetic range): 5.9 % (ref 0.0–7.0)

## 2020-12-30 MED ORDER — ALENDRONATE SODIUM 70 MG PO TABS: ORAL_TABLET | ORAL | 11 refills | Status: DC

## 2020-12-30 MED ORDER — LISINOPRIL 5 MG PO TABS: 2.5000 mg | ORAL_TABLET | Freq: Every day | ORAL | 3 refills | Status: DC

## 2020-12-30 MED ORDER — METFORMIN HCL 1000 MG PO TABS: 500.0000 mg | ORAL_TABLET | Freq: Every day | ORAL | 1 refills | Status: DC

## 2020-12-30 MED ORDER — GABAPENTIN 100 MG PO CAPS: ORAL_CAPSULE | ORAL | 0 refills | Status: DC

## 2020-12-30 MED ORDER — ATORVASTATIN CALCIUM 40 MG PO TABS: 40.0000 mg | ORAL_TABLET | Freq: Every day | ORAL | 3 refills | Status: DC

## 2020-12-31 LAB — BASIC METABOLIC PANEL WITH GFR
BUN/Creatinine Ratio: 22 (ref 12–28)
BUN: 14 mg/dL (ref 8–27)
CO2: 24 mmol/L (ref 20–29)
Calcium: 9.3 mg/dL (ref 8.7–10.3)
Chloride: 100 mmol/L (ref 96–106)
Creatinine, Ser: 0.64 mg/dL (ref 0.57–1.00)
Glucose: 135 mg/dL — ABNORMAL HIGH (ref 65–99)
Potassium: 4.4 mmol/L (ref 3.5–5.2)
Sodium: 139 mmol/L (ref 134–144)
eGFR: 90 mL/min/1.73

## 2020-12-31 LAB — LIPID PANEL
Chol/HDL Ratio: 5.8 ratio — ABNORMAL HIGH (ref 0.0–4.4)
Cholesterol, Total: 228 mg/dL — ABNORMAL HIGH (ref 100–199)
HDL: 39 mg/dL — ABNORMAL LOW (ref >39)
LDL Chol Calc (NIH): 136 mg/dL — ABNORMAL HIGH (ref 0–99)
Triglycerides: 296 mg/dL — ABNORMAL HIGH (ref 0–149)
VLDL Cholesterol Cal: 53 mg/dL — ABNORMAL HIGH (ref 5–40)

## 2021-01-01 ENCOUNTER — Encounter: Payer: Self-pay | Admitting: Family Medicine

## 2021-01-01 NOTE — Assessment & Plan Note (Signed)
Normotensive and at goal <120/80.  Has not been taking medication for 1 months -Will restart Lisinopril at lower dose 2.5 mg daily -BMet today -Repeat BMet in 1 week -Will continue to monitor, she had low diastolic pressure previously and may need discontinue medications -Monitor BP at home and notify MD if SBP<100 or symptomatic -Follow up with lab results -Follow up 2-3 weeks or sooner if needed

## 2021-01-01 NOTE — Assessment & Plan Note (Signed)
Per chart review, appears to have lost 10 lbs since August 2021. In Jan 2022 there was concern for a 20lbs weight loss by family after the loss of family member and was advised to follow up with PCP.  Unfortunately this was not addressed at subsequent visits.  I do not see a recent colonoscopy on file. Last mammogram 2018 negative PAP no documentation, patient was > 67 when established care Colonoscopy, no documentation of completion.  Patient was given number to schedule back in 2018. I think patient would benefit from a Geriatric clinic visit Follow up appointment scheduled in sept with PCP.  Will further evaluate weight loss.

## 2021-01-01 NOTE — Assessment & Plan Note (Addendum)
Last LDL 50 (12/21), goal <70. Has been without medication for 1 month -Lipid panel today -Continue Atorvastatin 40 mg daily -Repeat in 6 months

## 2021-01-01 NOTE — Assessment & Plan Note (Signed)
Asymptomatic.  A1c 5.9 today.  Has been off Metformin for 1 month -Restart Metformin at lower dose.  500 mg daily -Repeat A1c in 3 months, if continues to remain low will discontinue Metformin -Will check Vit B12 at next visit -Foot Exam today -Encouraged yearly eye exam -Encouraged Shingrix, sent prescription -Follow up in 3 months

## 2021-01-06 ENCOUNTER — Other Ambulatory Visit: Payer: Medicare Other

## 2021-01-06 ENCOUNTER — Other Ambulatory Visit: Payer: Self-pay

## 2021-01-06 DIAGNOSIS — E1142 Type 2 diabetes mellitus with diabetic polyneuropathy: Secondary | ICD-10-CM

## 2021-01-06 DIAGNOSIS — R634 Abnormal weight loss: Secondary | ICD-10-CM

## 2021-01-06 DIAGNOSIS — E440 Moderate protein-calorie malnutrition: Secondary | ICD-10-CM

## 2021-01-07 LAB — COMPREHENSIVE METABOLIC PANEL
ALT: 11 IU/L (ref 0–32)
AST: 16 IU/L (ref 0–40)
Albumin/Globulin Ratio: 1.6 (ref 1.2–2.2)
Albumin: 4.5 g/dL (ref 3.7–4.7)
Alkaline Phosphatase: 83 IU/L (ref 44–121)
BUN/Creatinine Ratio: 21 (ref 12–28)
BUN: 12 mg/dL (ref 8–27)
Bilirubin Total: <0.2 mg/dL (ref 0.0–1.2)
CO2: 23 mmol/L (ref 20–29)
Calcium: 9.2 mg/dL (ref 8.7–10.3)
Chloride: 103 mmol/L (ref 96–106)
Creatinine, Ser: 0.56 mg/dL — ABNORMAL LOW (ref 0.57–1.00)
Globulin, Total: 2.8 g/dL (ref 1.5–4.5)
Glucose: 93 mg/dL (ref 65–99)
Potassium: 5 mmol/L (ref 3.5–5.2)
Sodium: 140 mmol/L (ref 134–144)
Total Protein: 7.3 g/dL (ref 6.0–8.5)
eGFR: 93 mL/min/{1.73_m2} (ref >59)

## 2021-01-07 LAB — PREALBUMIN: PREALBUMIN: 22 mg/dL (ref 9–32)

## 2021-01-07 LAB — VITAMIN B12: Vitamin B-12: 224 pg/mL — ABNORMAL LOW (ref 232–1245)

## 2021-01-20 ENCOUNTER — Encounter: Payer: Self-pay | Admitting: Family Medicine

## 2021-01-20 ENCOUNTER — Other Ambulatory Visit: Payer: Self-pay

## 2021-01-20 ENCOUNTER — Ambulatory Visit (INDEPENDENT_AMBULATORY_CARE_PROVIDER_SITE_OTHER): Payer: Medicare Other | Admitting: Family Medicine

## 2021-01-20 VITALS — BP 171/80 | HR 77 | Wt 129.2 lb

## 2021-01-20 DIAGNOSIS — M810 Age-related osteoporosis without current pathological fracture: Secondary | ICD-10-CM

## 2021-01-20 DIAGNOSIS — R634 Abnormal weight loss: Secondary | ICD-10-CM

## 2021-01-20 DIAGNOSIS — E1142 Type 2 diabetes mellitus with diabetic polyneuropathy: Secondary | ICD-10-CM

## 2021-01-20 DIAGNOSIS — I10 Essential (primary) hypertension: Secondary | ICD-10-CM

## 2021-01-20 DIAGNOSIS — M792 Neuralgia and neuritis, unspecified: Secondary | ICD-10-CM | POA: Diagnosis not present

## 2021-01-20 MED ORDER — VITAMIN B-12 1000 MCG PO TABS: 1000.0000 ug | ORAL_TABLET | Freq: Every day | ORAL | 1 refills | Status: AC

## 2021-01-20 MED ORDER — LISINOPRIL 5 MG PO TABS: 5.0000 mg | ORAL_TABLET | Freq: Every day | ORAL | 1 refills | Status: DC

## 2021-01-20 NOTE — Patient Instructions (Addendum)
Thank you for coming to see me today. It was a pleasure.   Start Vitamin B12.  Take 1 tablet once a day.  Will recheck your labs in 3-6 months.  Increase blood pressure medication to 5 mg daily.  Come to clinic next week for blood work only.    Referral sent to the [facility] doctor  Consider colonoscopy for screening.  Please follow-up with PCP in 2 weeks  If you have any questions or concerns, please do not hesitate to call the office at [phone].  Best,   Dana Allan, MD

## 2021-01-20 NOTE — Progress Notes (Signed)
    SUBJECTIVE:   CHIEF COMPLAINT / HPI: follow up BP   Due to language barrier, an interpreter was present during the history-taking and subsequent discussion (and for part of the physical exam) with this patient.   Presents for follow up for Blood Pressure monitoring. Seen in clinic on 08/17 and BP was at goal 124/64 off Lisinopril for 1 month.  Lisinopril was restarted at 2.5 mg.  Has not been checking BP at home.  Denies any visual disturbances, chest pain, headaches,or shortness of breath.    Diabetic Neuropathy Gabapentin has been helping. Still has some mild bilateral burning sensation on bottom feet. Denies any pain or lower extremity swelling.  Weight loss Denies any recent weight loss. Reports appetite ok.   PERTINENT  PMH / PSH:  Type 2 DM HTN HLD Peripheral Neuropathy Osteoporosis   OBJECTIVE:   BP (!) 171/80   Pulse 77   Wt 129 lb 3.2 oz (58.6 kg)   SpO2 99%   BMI 22.89 kg/m    General: Alert, no acute distress Cardio: Normal S1 and S2, RRR, no r/m/g Pulm: CTAB, normal work of breathing Abdomen: Bowel sounds normal. Abdomen soft and non-tender.  Extremities: No peripheral edema.   ASSESSMENT/PLAN:   HTN (hypertension) Elevated BP today.Goal <120/80.Compliant with Lisinopril 2.5mg  qhs.  -Increase Lisinopril 5 mg daily -Bmet in 1 week -Follow up in 2 weeks -Strict return precautions provided  Neuropathic pain Gabapentin helping. Vit B 12 levels low which may be contributing. -Continue Gabapentin 100 mg qhs, can increase if needed to 200 mg -Start Vitamin B 12 1000 mcg daily -Recheck Vit B level 12 3-6 months -Follow up as needed   Weight loss Weight increased 4lbs   since last visit.   -Continue to monitor nutrition status -Offered Colonoscopy referral. Patient will discuss with family and decide at next visit   Osteoporosis Dexa scan ordered today Continue Fosamax     Dana Allan, MD [facility]

## 2021-01-23 ENCOUNTER — Encounter: Payer: Self-pay | Admitting: Family Medicine

## 2021-01-23 NOTE — Assessment & Plan Note (Signed)
Gabapentin helping. Vit B 12 levels low which may be contributing. -Continue Gabapentin 100 mg qhs, can increase if needed to 200 mg -Start Vitamin B 12 1000 mcg daily -Recheck Vit B level 12 3-6 months -Follow up as needed

## 2021-01-23 NOTE — Assessment & Plan Note (Signed)
Dexa scan ordered today Continue Fosamax

## 2021-01-23 NOTE — Assessment & Plan Note (Signed)
Weight increased 4lbs   since last visit.   -Continue to monitor nutrition status -Offered Colonoscopy referral. Patient will discuss with family and decide at next visit

## 2021-01-23 NOTE — Assessment & Plan Note (Signed)
Elevated BP today.Goal <120/80.Compliant with Lisinopril 2.5mg  qhs.  -Increase Lisinopril 5 mg daily -Bmet in 1 week -Follow up in 2 weeks -Strict return precautions provided

## 2021-01-27 ENCOUNTER — Other Ambulatory Visit: Payer: Medicare Other

## 2021-01-28 ENCOUNTER — Other Ambulatory Visit: Payer: Self-pay

## 2021-01-28 ENCOUNTER — Other Ambulatory Visit: Payer: Medicare Other

## 2021-01-28 DIAGNOSIS — I10 Essential (primary) hypertension: Secondary | ICD-10-CM

## 2021-01-29 ENCOUNTER — Encounter: Payer: Self-pay | Admitting: Family Medicine

## 2021-01-29 LAB — BASIC METABOLIC PANEL
BUN/Creatinine Ratio: 25 (ref 12–28)
BUN: 15 mg/dL (ref 8–27)
CO2: 27 mmol/L (ref 20–29)
Calcium: 9.6 mg/dL (ref 8.7–10.3)
Chloride: 98 mmol/L (ref 96–106)
Creatinine, Ser: 0.6 mg/dL (ref 0.57–1.00)
Glucose: 127 mg/dL — ABNORMAL HIGH (ref 65–99)
Potassium: 4.2 mmol/L (ref 3.5–5.2)
Sodium: 138 mmol/L (ref 134–144)
eGFR: 92 mL/min/{1.73_m2} (ref >59)

## 2021-02-04 ENCOUNTER — Other Ambulatory Visit: Payer: Self-pay

## 2021-02-04 ENCOUNTER — Encounter: Payer: Self-pay | Admitting: Student

## 2021-02-04 ENCOUNTER — Ambulatory Visit (INDEPENDENT_AMBULATORY_CARE_PROVIDER_SITE_OTHER): Payer: Medicare Other | Admitting: Student

## 2021-02-04 VITALS — BP 103/51 | HR 63 | Ht 63.0 in | Wt 127.4 lb

## 2021-02-04 DIAGNOSIS — Z23 Encounter for immunization: Secondary | ICD-10-CM

## 2021-02-04 DIAGNOSIS — I10 Essential (primary) hypertension: Secondary | ICD-10-CM

## 2021-02-04 MED ORDER — LISINOPRIL 2.5 MG PO TABS: 2.5000 mg | ORAL_TABLET | Freq: Every day | ORAL | 1 refills | Status: DC

## 2021-02-04 NOTE — Patient Instructions (Signed)
It was great to see you! Thank you for allowing me to participate in your care!  I recommend that you always bring your medications to each appointment as this makes it easy to ensure we are on the correct medications and helps [place] not miss when refills are needed.  Our plans for today:  - STOP taking Lisinopril 5mg . START taking Lisinopril 2.5mg  daily. - Seek urgent care if you develop chest pain, shortness of breath, dizziness, weakness.  Take care and seek immediate care sooner if you develop any concerns.   Dr. , DO [facility]

## 2021-02-04 NOTE — Progress Notes (Signed)
    SUBJECTIVE:   CHIEF COMPLAINT / HPI:   Daughter-in-law served as interpreter during visit due to language barrier.   Martha Gillespie presents for 2 week follow-up blood pressure monitoring for hypertension. She was increased from 2.5mg  to 5mg  lisinopril at last office visit on 01/20/21. She has been compliant with medications. No chest pain, headache, shortness of breath, visual disturbances, weakness or dizziness.  No complaints today.  PERTINENT  PMH / PSH:  HTN Type 2 DM Peripheral neuropathy Osteoporosis  OBJECTIVE:   BP (!) 103/51   Pulse 63   Ht 5\' 3"  (1.6 m)   Wt 127 lb 6 oz (57.8 kg)   SpO2 93%   BMI 22.56 kg/m   General: Well-appearing, in no distress CV: RRR Lungs: Mild inspiratory crackles in left lower lung field. No wheezing. No increased work of breathing. Abdomen: Soft, non-tender Extremities: No lower extremity edema.   ASSESSMENT/PLAN:   HTN (hypertension) BP readings well-below goal range today, placing her at risk of falls/adverse effects. Initial reading 96/62, repeat 103/51. Patient has no weakness, dizziness, lightheadedness. BMP from 01/28/21 within normal limits (Cr 0.60,Na 138, K+ 4.2, Ca 9.6), glucose elevated to 127. - Decrease lisinopril to 2.5mg  daily, prescription sent to pharmacy - Strict return precautions provided - Follow up 1 month    05-18-1968, DO [facility]

## 2021-02-04 NOTE — Assessment & Plan Note (Signed)
BP readings well-below goal range today, placing her at risk of falls/adverse effects. Initial reading 96/62, repeat 103/51. Patient has no weakness, dizziness, lightheadedness. BMP from 01/28/21 within normal limits (Cr 0.60,Na 138, K+ 4.2, Ca 9.6), glucose elevated to 127. - Decrease lisinopril to 2.5mg  daily, prescription sent to pharmacy - Strict return precautions provided - Follow up 1 month

## 2021-03-24 ENCOUNTER — Ambulatory Visit (HOSPITAL_COMMUNITY)
Admission: EM | Admit: 2021-03-24 | Discharge: 2021-03-24 | Disposition: A | Discharge status: Home / Self Care | Payer: Medicare Other | Attending: Medical Oncology | Admitting: Medical Oncology

## 2021-03-24 ENCOUNTER — Encounter (HOSPITAL_COMMUNITY): Payer: Self-pay | Admitting: Emergency Medicine

## 2021-03-24 ENCOUNTER — Other Ambulatory Visit: Payer: Self-pay

## 2021-03-24 DIAGNOSIS — J069 Acute upper respiratory infection, unspecified: Secondary | ICD-10-CM | POA: Diagnosis not present

## 2021-03-24 DIAGNOSIS — R0981 Nasal congestion: Secondary | ICD-10-CM | POA: Diagnosis not present

## 2021-03-24 MED ORDER — FLUTICASONE PROPIONATE 50 MCG/ACT NA SUSP: 2.0000 | Freq: Every day | NASAL | 0 refills | Status: DC

## 2021-03-24 MED ORDER — BENZONATATE 100 MG PO CAPS: 100.0000 mg | ORAL_CAPSULE | Freq: Three times a day (TID) | ORAL | 0 refills | Status: DC

## 2021-03-24 NOTE — ED Provider Notes (Signed)
[facility]    CSN: [id number] Arrival date & time: 03/24/21  1742      History   Chief Complaint Chief Complaint  Patient presents with   Cough    HPI Martha Gillespie is a 80 y.o. female. Pt is here with her granddaughter who she wishes to translate for her.   HPI  Cough: Pt reports that for the past 1-2 weeks she has had a dry cough. Feels like something is stuck in the throat- does not feel like a flare of her COPD Normal O2 saturation give her COPD is 93%. She has tried OTC cough and cold medication for symptoms with some relief. No fevers, SOB, wheeze.   Past Medical History:  Diagnosis Date   Abdominal pain 02/13/2012   Diabetes mellitus (HCC) 02/15/2012   Dizziness 08/15/2019   Eye pain, bilateral 06/20/2016   Hypertension     Patient Active Problem List   Diagnosis Date Noted   Chronic nonintractable headache 06/03/2020   Weight loss 06/03/2020   Hearing loss 10/27/2018   Osteoporosis 07/31/2017   Memory problem 09/30/2016   Osteoarthritis of left knee 07/26/2016   Hyperlipidemia 06/20/2016   Neuropathic pain 06/20/2016   HTN (hypertension) 06/26/2014   Diabetes mellitus (HCC) 02/15/2012   COPD (chronic obstructive pulmonary disease) (HCC) 02/15/2012    Past Surgical History:  Procedure Laterality Date   ESOPHAGOGASTRODUODENOSCOPY  02/14/2012   Procedure: ESOPHAGOGASTRODUODENOSCOPY (EGD);  Surgeon: Theda Belfast, MD;  Location: [facility] ENDOSCOPY;  Service: Endoscopy;  Laterality: N/A;    OB History   No obstetric history on file.      Home Medications    Prior to Admission medications   Medication Sig Start Date End Date Taking? Authorizing Provider  alendronate (FOSAMAX) 70 MG tablet TAKE ONE TABLET BY MOUTH EVERY 7 DAYS WITH full GLASS of water ON EMPTY stomach 12/30/20   Dana Allan, MD  atorvastatin (LIPITOR) 40 MG tablet Take 1 tablet (40 mg total) by mouth daily. 12/30/20   Dana Allan, MD  Calcium Carbonate-Vitamin D 600-400 MG-UNIT  tablet TAKE TWO TABLETS BY MOUTH EVERY DAY WITH FOOD 06/07/19   Lennox Solders, MD  gabapentin (NEURONTIN) 100 MG capsule TAKE 1 CAPSULE BY MOUTH AT BEDTIME. 12/30/20   Dana Allan, MD  lisinopril (ZESTRIL) 2.5 MG tablet Take 1 tablet (2.5 mg total) by mouth at bedtime. 02/04/21   Dameron, Nolberto Hanlon, DO  metFORMIN (GLUCOPHAGE) 1000 MG tablet Take 0.5 tablets (500 mg total) by mouth daily. 12/30/20   Dana Allan, MD  polyethylene glycol powder (GLYCOLAX/MIRALAX) 17 GM/SCOOP powder Take 17 g by mouth 2 (two) times daily as needed. 07/14/20   Dana Allan, MD  Skin Protectants, Misc. (EUCERIN) cream Apply topically as needed for dry skin. 10/18/17   Almon Hercules, MD    Family History Family History  Family history unknown: Yes    Social History Social History   Tobacco Use   Smoking status: Some Days    Types: Cigarettes    Last attempt to quit: 07/25/2013    Years since quitting: 7.6   Smokeless tobacco: Never  Vaping Use   Vaping Use: Never used  Substance Use Topics   Alcohol use: No   Drug use: No     Allergies   Patient has no known allergies.   Review of Systems Review of Systems  As stated above in HPI Physical Exam Triage Vital Signs ED Triage Vitals  Enc Vitals Group     BP 03/24/21  1839 (!) 146/86     Pulse Rate 03/24/21 1839 84     Resp 03/24/21 1839 20     Temp 03/24/21 1839 98.3 F (36.8 C)     Temp Source 03/24/21 1839 Oral     SpO2 03/24/21 1839 93 %     Weight --      Height --      Head Circumference --      Peak Flow --      Pain Score 03/24/21 1836 0     Pain Loc --      Pain Edu? --      Excl. in GC? --    No data found.  Updated Vital Signs BP (!) 146/86 (BP Location: Left Arm)   Pulse 84   Temp 98.3 F (36.8 C) (Oral)   Resp 20   SpO2 93%   Physical Exam Vitals and nursing note reviewed.  Constitutional:      General: She is not in acute distress.    Appearance: Normal appearance. She is not ill-appearing, toxic-appearing or  diaphoretic.  HENT:     Head: Normocephalic and atraumatic.     Right Ear: Tympanic membrane normal.     Left Ear: Tympanic membrane normal.     Nose: Congestion and rhinorrhea present.     Mouth/Throat:     Mouth: Mucous membranes are moist.     Pharynx: No oropharyngeal exudate or posterior oropharyngeal erythema.     Comments: Mild post nasal drainage Eyes:     Extraocular Movements: Extraocular movements intact.     Pupils: Pupils are equal, round, and reactive to light.  Cardiovascular:     Rate and Rhythm: Normal rate and regular rhythm.     Pulses: Normal pulses.     Heart sounds: Normal heart sounds.  Pulmonary:     Effort: Pulmonary effort is normal.     Breath sounds: Normal breath sounds.  Musculoskeletal:     Cervical back: Normal range of motion and neck supple.  Lymphadenopathy:     Cervical: No cervical adenopathy.  Skin:    General: Skin is warm.  Neurological:     Mental Status: She is alert and oriented to person, place, and time.     UC Treatments / Results  Labs (all labs ordered are listed, but only abnormal results are displayed) Labs Reviewed - No data to display  EKG   Radiology No results found.  Procedures Procedures (including critical care time)  Medications Ordered in UC Medications - No data to display  Initial Impression / Assessment and Plan / UC Course  I have reviewed the triage vital signs and the nursing notes.  Pertinent labs & imaging results that were available during my care of the patient were reviewed by me and considered in my medical decision making (see chart for details).     New.  Treating with Tessalon and Flonase for what is likely a viral URI with cough and congestion.  Discussed red flag signs and symptoms.  Follow-up as needed. Final Clinical Impressions(s) / UC Diagnoses   Final diagnoses:  None   Discharge Instructions   None    ED Prescriptions   None    PDMP not reviewed this encounter.    Rushie Chestnut, [place] 03/24/21 1915

## 2021-03-24 NOTE — ED Triage Notes (Signed)
Cough for 1-2 weeks.  Feels like something stuck in throat, does not hurt.

## 2021-04-01 ENCOUNTER — Other Ambulatory Visit: Payer: Self-pay | Admitting: Family Medicine

## 2021-04-01 DIAGNOSIS — R202 Paresthesia of skin: Secondary | ICD-10-CM

## 2021-05-04 NOTE — Patient Instructions (Addendum)
It was wonderful to see you today.  Please bring ALL of your medications with you to every visit.   Today we talked about:  -I think your cough may be related to the Lisinopril. STOP the lisinopril and START taking the Losartan.  -Return in 1 week for a blood pressure check and to follow up on the cough.   Thank you for choosing [facility].   Please call [phone] with any questions about today's appointment.  Please be sure to schedule follow up at the front  desk before you leave today.   Sabino Dick, DO PGY-2 Family Medicine

## 2021-05-04 NOTE — Progress Notes (Addendum)
° ° °  SUBJECTIVE:   CHIEF COMPLAINT / HPI:   Daughter in law used as Nepali interpreter used for entirety of visit.  Cough Martha Gillespie is a 80 y.o. female with PMHx significant for COPD, hypertension, hyperlipidemia, diabetes with neuropathic pain, who presents to the family medicine clinic with complaints of dry (sometimes productive white) cough since 2 months without associated symptoms. Of note, she is on lisinopril 2.5 mg. Reports having been on this medication since 4-5 months.  Per chart review, she was seen at urgent care on 11/9 for 1 to 2-week dry cough.  She was diagnosed with viral URI and nasal congestion and treated with Tessalon and Flonase.  No chest x-ray was done at the time. Tessalon perles helped previously. Denies hemoptysis, unintentional weight loss, night sweats, recent travel.   PERTINENT  PMH / PSH:  COPD, T2DM, HLD, HTN   OBJECTIVE:   BP (!) 142/76    Pulse 82    Temp 98.6 F (37 C)    Wt 131 lb (59.4 kg)    SpO2 96%    BMI 23.21 kg/m    General: NAD, pleasant Nepalese woman dressed in traditional clothing, able to participate in exam Cardiac: RRR, no murmurs. Respiratory: CTAB, normal effort, No wheezes, rales or rhonchi Abdomen: Bowel sounds present, mildly tender to suprapubic region, no  epigastric tenderness, nondistended, no hepatosplenomegaly. Back: No CVAT B/l Skin: warm and dry, no rashes noted  ASSESSMENT/PLAN:   Cough due to ACE inhibitor Suspect that patient has a cough induced by her lisinopril given timeline of starting medication 4 to 5 months ago and the cough occurring 2 months ago. In the room it sounded dry and non-productive. She has no associated symptoms that would make me think this is infectious.  No TB signs or symptoms (no fever, hemoptysis, unintentional weight loss, night sweats).  -Stop Lisinopril -Start Losartan -F/u in 1 week to reassess and for BMP/BP recheck  -Don't feel that CXR is indicated at this time; could consider  if not improving   Dysuria Acute dysuria for the last 3 days. Increased urinary frequency. No fevers. Suspect most likely symptomatic UTI. No CVAT or reported hematuria to make me think renal stones, however UA does show large blood. Per chart review, has had large blood in UA 9 months ago as well.  -UA with large blood, 30 protein, moderate leukocytes, negative Nitrites. UCx sent.  -Will empirically treat with Keflex -Will need f/u UA to ensure resolution of hematuria (will need to test 6-weeks after resolution of UTI).  -Will need Urology referral if persistent.     Sabino Dick, DO [facility]

## 2021-05-05 ENCOUNTER — Other Ambulatory Visit: Payer: Self-pay

## 2021-05-05 ENCOUNTER — Ambulatory Visit (INDEPENDENT_AMBULATORY_CARE_PROVIDER_SITE_OTHER): Payer: Medicare Other | Admitting: Family Medicine

## 2021-05-05 VITALS — BP 142/76 | HR 82 | Temp 98.6°F | Wt 131.0 lb

## 2021-05-05 DIAGNOSIS — R058 Other specified cough: Secondary | ICD-10-CM | POA: Diagnosis not present

## 2021-05-05 DIAGNOSIS — T464X5A Adverse effect of angiotensin-converting-enzyme inhibitors, initial encounter: Secondary | ICD-10-CM | POA: Insufficient documentation

## 2021-05-05 DIAGNOSIS — R300 Dysuria: Secondary | ICD-10-CM | POA: Insufficient documentation

## 2021-05-05 HISTORY — DX: Adverse effect of angiotensin-converting-enzyme inhibitors, initial encounter: T46.4X5A

## 2021-05-05 HISTORY — DX: Dysuria: R30.0

## 2021-05-05 LAB — POCT URINALYSIS DIP (CLINITEK)
Bilirubin, UA: NEGATIVE
Glucose, UA: NEGATIVE mg/dL
Ketones, POC UA: NEGATIVE mg/dL
Nitrite, UA: NEGATIVE
POC PROTEIN,UA: 30 — AB
Spec Grav, UA: 1.01 (ref 1.010–1.025)
Urobilinogen, UA: 0.2 E.U./dL
pH, UA: 6.5 (ref 5.0–8.0)

## 2021-05-05 MED ORDER — LOSARTAN POTASSIUM 25 MG PO TABS: 25.0000 mg | ORAL_TABLET | Freq: Every day | ORAL | 3 refills | Status: DC

## 2021-05-05 MED ORDER — CEPHALEXIN 500 MG PO CAPS: 500.0000 mg | ORAL_CAPSULE | Freq: Two times a day (BID) | ORAL | 0 refills | Status: DC

## 2021-05-05 NOTE — Assessment & Plan Note (Signed)
Suspect that patient has a cough induced by her lisinopril given timeline of starting medication 4 to 5 months ago and the cough occurring 2 months ago. In the room it sounded dry and non-productive. She has no associated symptoms that would make me think this is infectious.  No TB signs or symptoms (no fever, hemoptysis, unintentional weight loss, night sweats).  -Stop Lisinopril -Start Losartan -F/u in 1 week to reassess and for BMP/BP recheck  -Don't feel that CXR is indicated at this time; could consider if not improving

## 2021-05-05 NOTE — Assessment & Plan Note (Addendum)
Acute dysuria for the last 3 days. Increased urinary frequency. No fevers. Suspect most likely symptomatic UTI. No CVAT or reported hematuria to make me think renal stones, however UA does show large blood. Per chart review, has had large blood in UA 9 months ago as well.  -UA with large blood, 30 protein, moderate leukocytes, negative Nitrites. UCx sent.  -Will empirically treat with Keflex -Will need f/u UA to ensure resolution of hematuria (will need to test 6-weeks after resolution of UTI).  -Will need Urology referral if persistent.

## 2021-05-05 NOTE — Addendum Note (Signed)
Addended by: Sabino Dick on: 05/05/2021 11:39 AM   Modules accepted: Orders

## 2021-05-08 NOTE — Progress Notes (Signed)
° ° °  SUBJECTIVE:   CHIEF COMPLAINT / HPI:   Presents to visit with daughter-in-law who interprets for patient.   Blood Pressure F/U   Cough F/U  Martha Gillespie is a 80 y.o. female who presents to the clinic today for follow up on her blood pressure. She was last seen on 12/21 and Lisinopril was d/c as it was thought to be contributing to her subacute dry cough. She was started on 25 mg Losartan. She reports cough has resolved! Reports good compliance with the Losartan and no side effects. Denies chest pain, shortness of breath, lower extremity edema, headaches.   Dysuria   UTI  At last visit patient was started on Keflex for UTI. Urine culture returned with 25-50k CFU of cutibacterium acnes . Patient reports that her dysuria has resolved. She has finished her antibiotics.   PERTINENT  PMH / PSH:  Past Medical History:  Diagnosis Date   Abdominal pain 02/13/2012   Diabetes mellitus (HCC) 02/15/2012   Dizziness 08/15/2019   Eye pain, bilateral 06/20/2016   Hypertension    OBJECTIVE:   BP 131/72    Pulse 94    Ht 5\' 3"  (1.6 m)    Wt 136 lb (61.7 kg)    SpO2 96%    BMI 24.09 kg/m    General: NAD, pleasant elderly woman in traditional clothing, able to participate in exam Cardiac: RRR, no murmurs. Respiratory: CTAB, normal effort, No wheezes, rales or rhonchi Extremities: no edema  ASSESSMENT/PLAN:   Cough due to ACE inhibitor Cough has resolved since last visit which leads me to further believe it was due to Lisinopril. Have added to patients allergy list.   HTN (hypertension) Switched to Losartan at previous visit. Tolerating well without adverse effects. BP today at goal.  -BMP today -Continue Losartan 25 mg daily  Dysuria Was empirically treated with Keflex though urine culture returned without significant growth.  Reassuringly, she feels improved.  Hematuria Seen on UA at last visit and 10 months ago. Microscopy was not done.  -Repeat UA today with microscopy -May  need Urology referral if >/= 3 RBC   Diabetes mellitus Well controlled. Discussed that she should follow up with PCP in February for next diabetes follow up.     March, DO [facility]

## 2021-05-11 NOTE — Patient Instructions (Signed)
It was wonderful to see you today.  Please bring ALL of your medications with you to every visit.   Today we talked about:  -I am so glad your cough has resolved! -Your blood pressure today was improved today! It was 131/72.  -We are checking blood work today to check your kidney function and electrolytes. I will contact you with the results if abnormal.  -Continue the Losartan 25 mg daily.   Thank you for choosing [facility].   Please call [phone] with any questions about today's appointment.  Please be sure to schedule follow up at the front  desk before you leave today.   Sabino Dick, DO PGY-2 Family Medicine

## 2021-05-12 ENCOUNTER — Ambulatory Visit (INDEPENDENT_AMBULATORY_CARE_PROVIDER_SITE_OTHER): Payer: Medicare Other | Admitting: Family Medicine

## 2021-05-12 ENCOUNTER — Other Ambulatory Visit: Payer: Self-pay

## 2021-05-12 ENCOUNTER — Telehealth: Payer: Self-pay | Admitting: Family Medicine

## 2021-05-12 VITALS — BP 131/72 | HR 94 | Ht 63.0 in | Wt 136.0 lb

## 2021-05-12 DIAGNOSIS — T464X5A Adverse effect of angiotensin-converting-enzyme inhibitors, initial encounter: Secondary | ICD-10-CM

## 2021-05-12 DIAGNOSIS — R300 Dysuria: Secondary | ICD-10-CM

## 2021-05-12 DIAGNOSIS — R319 Hematuria, unspecified: Secondary | ICD-10-CM

## 2021-05-12 DIAGNOSIS — I10 Essential (primary) hypertension: Secondary | ICD-10-CM

## 2021-05-12 DIAGNOSIS — E1142 Type 2 diabetes mellitus with diabetic polyneuropathy: Secondary | ICD-10-CM | POA: Diagnosis not present

## 2021-05-12 DIAGNOSIS — R058 Other specified cough: Secondary | ICD-10-CM | POA: Diagnosis not present

## 2021-05-12 HISTORY — DX: Hematuria, unspecified: R31.9

## 2021-05-12 NOTE — Assessment & Plan Note (Signed)
Seen on UA at last visit and 10 months ago. Microscopy was not done.  -Repeat UA today with microscopy -May need Urology referral if >/= 3 RBC

## 2021-05-12 NOTE — Assessment & Plan Note (Signed)
Well controlled. Discussed that she should follow up with PCP in February for next diabetes follow up.

## 2021-05-12 NOTE — Assessment & Plan Note (Signed)
Was empirically treated with Keflex though urine culture returned without significant growth.  Reassuringly, she feels improved.

## 2021-05-12 NOTE — Assessment & Plan Note (Signed)
Switched to Losartan at previous visit. Tolerating well without adverse effects. BP today at goal.  -BMP today -Continue Losartan 25 mg daily

## 2021-05-12 NOTE — Assessment & Plan Note (Signed)
Cough has resolved since last visit which leads me to further believe it was due to Lisinopril. Have added to patients allergy list.

## 2021-05-13 ENCOUNTER — Encounter: Payer: Self-pay | Admitting: Family Medicine

## 2021-05-13 LAB — BASIC METABOLIC PANEL
BUN/Creatinine Ratio: 19 (ref 12–28)
BUN: 12 mg/dL (ref 8–27)
CO2: 26 mmol/L (ref 20–29)
Calcium: 9.2 mg/dL (ref 8.7–10.3)
Chloride: 101 mmol/L (ref 96–106)
Creatinine, Ser: 0.63 mg/dL (ref 0.57–1.00)
Glucose: 214 mg/dL — ABNORMAL HIGH (ref 70–99)
Potassium: 4.2 mmol/L (ref 3.5–5.2)
Sodium: 139 mmol/L (ref 134–144)
eGFR: 91 mL/min/{1.73_m2} (ref >59)

## 2021-05-13 LAB — URINALYSIS, ROUTINE W REFLEX MICROSCOPIC
Bilirubin, UA: NEGATIVE
Glucose, UA: NEGATIVE
Ketones, UA: NEGATIVE
Leukocytes,UA: NEGATIVE
Nitrite, UA: NEGATIVE
Protein,UA: NEGATIVE
RBC, UA: NEGATIVE
Specific Gravity, UA: 1.012 (ref 1.005–1.030)
Urobilinogen, Ur: 0.2 mg/dL (ref 0.2–1.0)
pH, UA: 6 (ref 5.0–7.5)

## 2021-05-14 LAB — URINE CULTURE

## 2021-06-01 NOTE — Progress Notes (Deleted)
° ° °  SUBJECTIVE:   CHIEF COMPLAINT / HPI:   Cough  Martha Gillespie is a 81 y.o. female who presents to the [facility] accompanied by daughter in law for concerns of cough. Patient has been seen previously in the clinic (12/21, 12/28) for similar concerns. This was thought to be due to ACE-I as cough had subsided after discontinuation of Lisinopril. Reports ***   PERTINENT  PMH / PSH:  Past Medical History:  Diagnosis Date   Abdominal pain 02/13/2012   Diabetes mellitus ([place]) 02/15/2012   Dizziness 08/15/2019   Eye pain, bilateral 06/20/2016   Hypertension      OBJECTIVE:   There were no vitals taken for this visit. ***  General: NAD, pleasant, able to participate in exam Cardiac: RRR, no murmurs. Respiratory: CTAB, normal effort, No wheezes, rales or rhonchi Abdomen: Bowel sounds present, nontender, nondistended, no hepatosplenomegaly. Extremities: no edema or cyanosis. Skin: warm and dry, no rashes noted Neuro: alert, no obvious focal deficits Psych: Normal affect and mood  ASSESSMENT/PLAN:   No problem-specific Assessment & Plan notes found for this encounter.     Sharion Settler, [place]

## 2021-06-01 NOTE — Patient Instructions (Incomplete)
It was wonderful to see you today. ? ?Please bring ALL of your medications with you to every visit.  ? ?Today we talked about: ? ?** ? ? ?Thank you for choosing [facility].  ? ?Please call [phone] with any questions about today's appointment. ? ?Please be sure to schedule follow up at the front  desk before you leave today.  ? ?Jalesa Thien, DO ?PGY-2 Family Medicine   ?

## 2021-06-02 ENCOUNTER — Ambulatory Visit: Payer: Medicare Other | Admitting: Family Medicine

## 2021-06-03 ENCOUNTER — Other Ambulatory Visit: Payer: Self-pay

## 2021-06-03 ENCOUNTER — Ambulatory Visit (INDEPENDENT_AMBULATORY_CARE_PROVIDER_SITE_OTHER): Payer: Medicare Other | Admitting: Family Medicine

## 2021-06-03 VITALS — BP 138/68 | HR 93 | Wt 136.0 lb

## 2021-06-03 DIAGNOSIS — J069 Acute upper respiratory infection, unspecified: Secondary | ICD-10-CM | POA: Diagnosis not present

## 2021-06-03 DIAGNOSIS — I10 Essential (primary) hypertension: Secondary | ICD-10-CM | POA: Diagnosis not present

## 2021-06-03 HISTORY — DX: Acute upper respiratory infection, unspecified: J06.9

## 2021-06-03 MED ORDER — GUAIFENESIN 100 MG/5ML PO LIQD: 5.0000 mL | ORAL | 0 refills | Status: DC | PRN

## 2021-06-03 MED ORDER — SALINE SPRAY 0.65 % NA SOLN: 1.0000 | NASAL | 3 refills | Status: DC | PRN

## 2021-06-03 NOTE — Progress Notes (Signed)
° ° °  SUBJECTIVE:   CHIEF COMPLAINT / HPI:   Cough: Primary symptom: patient started having a dry cough, worse at night. They have not tried any medications yet. Duration: 5 days Severity: mild Associated symptoms: no fever, chills, rash, sinus pain, headache, sore throat, no SOB or DOE, no n/v, no rash Fever? Tmax?: none Sick contacts: not that they are aware of Covid test: none Covid vaccination(s): x3  HTN: On losartan 25 mg. BP is not at goal today 162/78, repeat 138/68. Asymptomatic. She was started on losartan in December, has had no problems with it, took it last night.  PERTINENT  PMH / PSH: hypertension  OBJECTIVE:   BP 138/68 (BP Location: Right Arm)    Pulse 93    Wt 136 lb (61.7 kg)    SpO2 93%    BMI 24.09 kg/m   Nursing note and vitals reviewed GEN: elderly Nepali woman, resting comfortably in chair, NAD, WNWD HEENT: NCAT. PERRLA. Sclera without injection or icterus. MMM. Clear oropharynx. Nasal passages are edematous and erythematous with clear, thin secretion. Neck: Supple. No LAD Cardiac: Regular rate and rhythm. Normal S1/S2. No murmurs, rubs, or gallops appreciated. 2+ radial pulses. Lungs: Clear bilaterally to ascultation. No increased WOB, no accessory muscle usage. No w/r/r. Neuro: AOx3  Ext: no edema Psych: Pleasant and appropriate   ASSESSMENT/PLAN:   HTN (hypertension) Improvement in BP toward end of visit. Recommend they take BP at home for 1 week, then follow up with PCP to check BP.  URI with cough and congestion Elderly nepali woman with 5 days of URI symptoms. Recommend nasal saline to at night to help decrease PND. Small children are in the home, do not recommend tessalon perles. Discussed supportive care and return precautions. Follow up if no improvement, fevers, or dyspnea.     Shirlean Mylar, MD [facility]

## 2021-06-03 NOTE — Assessment & Plan Note (Signed)
Improvement in BP toward end of visit. Recommend they take BP at home for 1 week, then follow up with PCP to check BP.

## 2021-06-03 NOTE — Patient Instructions (Addendum)
It was a pleasure to see you today!  For the cough you can try the cough syrup provided. The cough will last the longest, it can take 3 to 8 weeks to go away. If she still feels poorly, has trouble breathing, or has a fever, please call our office or go to the emergency room. If she's not improved in 2-3 weeks, return for evaluation.  For runny nose: I recommend using flonase in the morning. You can use nasal saline spray at night, which will help decrease the cough at night  Be Well,  Dr. Leary Roca  ?? ??????? ????? ???? ??????!  1. ?????? ???? ?????? ?????? ?????? ?? ???? ?????? ???? ??????????? ???? ???????? ???? ??????? ?????, ?? ???? ??? ? ???? ? ????????? ????? ????? ??? ??? ??? ??? ????? ????? ?????????, ??? ????? ?????? ?, ?? ????? ? ???, ????? ?????? ?????????? ?? ????????? ?? ???????? ?????? ????????? ??? ??? ?-? ??????? ????? ??? ???, ???????????? ???? ??????????? 2. ??? ????? ?? ????: ? ????? ??????? ?????? ???? ??????? ??????? ????? ????? ??? ??? ?????? ?????? ???? ??????????, ???? ????? ???? ?? ???? ????? ??????  ?????? ????????,  ?? ????? ?ja tim?l?'? d?kh?ra khus? l?gy?!  1. Kh?k?k? l?gi tap?'?l? prad?na gari'?k? kapha sirapa pray?ga garna saknuhuncha. Kh?k? sabaibhand? l?m? samayasam'ma rahancha, y? nik? huna 3 d?khi 8 hapt?sam'ma l?gna sakcha. Martha Gillespie un? ajhai pani kamaj?ra mahasusa gardaichin, s?sa ph?rna samasy? cha, v? jvar? cha bhan?, kr?pay? h?mr? k?ry?layam? kala garnuh?s v? ?patak?l?na k??h?m? j?nuh?s. Yadi un? 2-3 hapt?m? sudh?ra bha'?na bhan?, m?ly??kanak? l?gi pharkanuh?s. 2. N?ka bagn? k? l?g?: Ma bih?na phl?n?ja pray?ga garna siph?risa gardachu. Tap?'?? r?tam? n?ka namaka spr? pray?ga garna saknuhuncha, jasal? r?tam? kh?k? kama garna maddata garn?cha  r?mr? hunuh?s,  ?? mah?n?

## 2021-06-03 NOTE — Assessment & Plan Note (Signed)
Elderly nepali woman with 5 days of URI symptoms. Recommend nasal saline to at night to help decrease PND. Small children are in the home, do not recommend tessalon perles. Discussed supportive care and return precautions. Follow up if no improvement, fevers, or dyspnea.

## 2021-07-03 ENCOUNTER — Other Ambulatory Visit: Payer: Self-pay | Admitting: Family Medicine

## 2021-07-03 DIAGNOSIS — R202 Paresthesia of skin: Secondary | ICD-10-CM

## 2021-07-12 ENCOUNTER — Other Ambulatory Visit: Payer: Medicare Other

## 2021-09-24 ENCOUNTER — Other Ambulatory Visit: Payer: Self-pay | Admitting: Family Medicine

## 2021-09-24 DIAGNOSIS — E1142 Type 2 diabetes mellitus with diabetic polyneuropathy: Secondary | ICD-10-CM

## 2021-10-28 ENCOUNTER — Ambulatory Visit (INDEPENDENT_AMBULATORY_CARE_PROVIDER_SITE_OTHER): Payer: 59 | Admitting: Family Medicine

## 2021-10-28 ENCOUNTER — Encounter: Payer: Self-pay | Admitting: Family Medicine

## 2021-10-28 VITALS — BP 125/93 | HR 93 | Wt 142.0 lb

## 2021-10-28 DIAGNOSIS — I10 Essential (primary) hypertension: Secondary | ICD-10-CM

## 2021-10-28 DIAGNOSIS — R42 Dizziness and giddiness: Secondary | ICD-10-CM | POA: Diagnosis not present

## 2021-10-28 DIAGNOSIS — E1142 Type 2 diabetes mellitus with diabetic polyneuropathy: Secondary | ICD-10-CM

## 2021-10-28 NOTE — Patient Instructions (Addendum)
Thank you for coming to see me today. It was a pleasure.   We will get some labs today.  If they are abnormal or we need to do something about them, I will call you.  If they are normal, I will send you a message on MyChart (if it is active) or a letter in the mail.  If you don't hear from [place] in 2 weeks, please call the office at the number below.   Stop your blood pressure medicine medication today, the losartan.  Monitor blood pressure at home if greater than 160/100 please call the clinic to speak with an MD.  If any shortness of breath, chest pain, visual changes, slurred speech please go to the emergency department.  Increase fluid intake.  And add  I will also send a referral to PT to vestibular rehab.  Please follow-up with PCP in 1 week  If you have any questions or concerns, please do not hesitate to call the office at [phone].  Best,   Dana Allan, MD

## 2021-10-28 NOTE — Progress Notes (Unsigned)
    SUBJECTIVE:   CHIEF COMPLAINT / HPI: Dizziness  Interpreter offered, patient's daughter refused and will translate.  Daughter reports that her mother has complained of some dizziness.  This has been a chronic issue that she has had on and off in the past.  Reports dizziness worse in the morning when she gets up from lying to standing position.  Also has some dizziness when moving her head from side to side.  Denies any worsening headaches, visual changes, slurred speech, nausea/vomiting, abdominal pain, chest pain, shortness of breath, weakness, numbness or tingling of extremities.  Reports using a cane at home and sometimes is off balance.  Blood pressure medication was changed from lisinopril 2.5 mg to losartan 25 mg.  Reports that dizziness has been ongoing prior to change and has not worsened.   PERTINENT  PMH / PSH:  Hypertension Diabetes type 2 Chronic dizziness Chronic headaches  OBJECTIVE:   BP (!) 125/93   Pulse 93   Wt 142 lb (64.4 kg)   SpO2 93%   BMI 25.15 kg/m    General: Alert, no acute distress Cardio: Normal S1 and S2, RRR, no r/m/g Pulm: CTAB, normal work of breathing Neuro: CN II: PERRL CN III, IV,VI: EOMI CV V: Normal sensation in V1, V2, V3 CVII: Symmetric smile and brow raise CN VIII: Normal hearing CN IX,X: Symmetric palate raise  CN XI: 5/5 shoulder shrug CN XII: Symmetric tongue protrusion  UE and LE strength 5/5  ASSESSMENT/PLAN:   Dizziness Likely dizziness orthostatic hypotension given mostly occurs during morning from sitting to standing position.  Low suspicion for CVA/TIA, brain lesion given no focal deficits on neuro exam today.  Considered electrolyte abnormalities. -Bmet and CBC today -Orthostatics diastolic pressure decreases approximately 30 points from lying to sitting and sitting to standing. -Refer to Dr. Raymondo Band for 24-hour ambulatory monitoring -Stop losartan -We will refer to PT for vestibular rehab given unable to perform  exam today due to language barrier -Follow-up with PCP in 1 week or sooner if symptoms not improved.  Diabetes mellitus A1c today Continue metformin 500 mg daily  HTN (hypertension) Has had low diastolics in the past.  We will stop losartan today. -Refer to Dr. Raymondo Band for 24-hour blood pressure monitoring -Monitor blood pressure at home, if greater than 160/100 restart blood pressure medication -Follow-up with PCP in 1 week.     Dana Allan, MD [facility]

## 2021-10-29 LAB — CBC
Hematocrit: 41.5 % (ref 34.0–46.6)
Hemoglobin: 13.2 g/dL (ref 11.1–15.9)
MCH: 27 pg (ref 26.6–33.0)
MCHC: 31.8 g/dL (ref 31.5–35.7)
MCV: 85 fL (ref 79–97)
Platelets: 291 10*3/uL (ref 150–450)
RBC: 4.88 x10E6/uL (ref 3.77–5.28)
RDW: 15.1 % (ref 11.7–15.4)
WBC: 11.3 10*3/uL — ABNORMAL HIGH (ref 3.4–10.8)

## 2021-10-29 LAB — BASIC METABOLIC PANEL
BUN/Creatinine Ratio: 24 (ref 12–28)
BUN: 17 mg/dL (ref 8–27)
CO2: 21 mmol/L (ref 20–29)
Calcium: 9.4 mg/dL (ref 8.7–10.3)
Chloride: 95 mmol/L — ABNORMAL LOW (ref 96–106)
Creatinine, Ser: 0.71 mg/dL (ref 0.57–1.00)
Glucose: 325 mg/dL — ABNORMAL HIGH (ref 70–99)
Potassium: 4.4 mmol/L (ref 3.5–5.2)
Sodium: 134 mmol/L (ref 134–144)
eGFR: 86 mL/min/{1.73_m2} (ref >59)

## 2021-10-30 LAB — HEMOGLOBIN A1C
Est. average glucose Bld gHb Est-mCnc: 200 mg/dL
Hgb A1c MFr Bld: 8.6 % — ABNORMAL HIGH (ref 4.8–5.6)

## 2021-10-30 LAB — SPECIMEN STATUS REPORT

## 2021-10-31 ENCOUNTER — Encounter: Payer: Self-pay | Admitting: Family Medicine

## 2021-10-31 NOTE — Assessment & Plan Note (Signed)
Likely dizziness orthostatic hypotension given mostly occurs during morning from sitting to standing position.  Low suspicion for CVA/TIA, brain lesion given no focal deficits on neuro exam today.  Considered electrolyte abnormalities. -Bmet and CBC today -Orthostatics diastolic pressure decreases approximately 30 points from lying to sitting and sitting to standing. -Refer to Dr. Raymondo Band for 24-hour ambulatory monitoring -Stop losartan -We will refer to PT for vestibular rehab given unable to perform exam today due to language barrier -Follow-up with PCP in 1 week or sooner if symptoms not improved.

## 2021-10-31 NOTE — Assessment & Plan Note (Signed)
Has had low diastolics in the past.  We will stop losartan today. -Refer to Dr. Raymondo Band for 24-hour blood pressure monitoring -Monitor blood pressure at home, if greater than 160/100 restart blood pressure medication -Follow-up with PCP in 1 week.

## 2021-10-31 NOTE — Assessment & Plan Note (Signed)
A1c today Continue metformin 500 mg daily

## 2021-11-01 ENCOUNTER — Encounter: Payer: Self-pay | Admitting: Family Medicine

## 2021-11-02 ENCOUNTER — Ambulatory Visit: Payer: 59 | Attending: Family Medicine | Admitting: Physical Therapy

## 2021-11-02 ENCOUNTER — Encounter: Payer: Self-pay | Admitting: Physical Therapy

## 2021-11-02 DIAGNOSIS — R262 Difficulty in walking, not elsewhere classified: Secondary | ICD-10-CM | POA: Insufficient documentation

## 2021-11-02 DIAGNOSIS — R42 Dizziness and giddiness: Secondary | ICD-10-CM | POA: Insufficient documentation

## 2021-11-02 NOTE — Therapy (Signed)
OUTPATIENT PHYSICAL THERAPY VESTIBULAR EVALUATION     Patient Name: Martha Gillespie MRN: [id number] DOB:April 21, 1941, 81 y.o., female Today's Date: 11/02/2021  PCP: Dana Allan REFERRING PROVIDER: Dana Allan   PT End of Session - 11/02/21 1440     Visit Number 1    Date for PT Re-Evaluation 02/02/22    PT Start Time 1435    PT Stop Time 1520    PT Time Calculation (min) 45 min    Activity Tolerance Patient tolerated treatment well    Behavior During Therapy [facility] for tasks assessed/performed             Past Medical History:  Diagnosis Date   Abdominal pain 02/13/2012   Cough due to ACE inhibitor 05/05/2021   Diabetes mellitus (HCC) 02/15/2012   Dizziness 08/15/2019   Dysuria 05/05/2021   Eye pain, bilateral 06/20/2016   Hematuria 05/12/2021   Hypertension    URI with cough and congestion 06/03/2021   Weight loss 06/03/2020   Past Surgical History:  Procedure Laterality Date   ESOPHAGOGASTRODUODENOSCOPY  02/14/2012   Procedure: ESOPHAGOGASTRODUODENOSCOPY (EGD);  Surgeon: Theda Belfast, MD;  Location: [facility] ENDOSCOPY;  Service: Endoscopy;  Laterality: N/A;   Patient Active Problem List   Diagnosis Date Noted   Chronic nonintractable headache 06/03/2020   Dizziness 08/15/2019   Hearing loss 10/27/2018   Osteoporosis 07/31/2017   Memory problem 09/30/2016   Osteoarthritis of left knee 07/26/2016   Hyperlipidemia 06/20/2016   Neuropathic pain 06/20/2016   HTN (hypertension) 06/26/2014   Diabetes mellitus (HCC) 02/15/2012   COPD (chronic obstructive pulmonary disease) (HCC) 02/15/2012    ONSET DATE: 10/02/21  REFERRING DIAG: Dizziness  THERAPY DIAG:  Dizziness and giddiness  Difficulty in walking, not elsewhere classified  Rationale for Evaluation and Treatment Rehabilitation  SUBJECTIVE:   SUBJECTIVE STATEMENT: Patient reports that the dizziness started about 10 days ago, reports that feels pressure in the head and blurry vision.    She reports that last week  they changed some medications and it is better but still there. Pt accompanied by: interpreter: Kristeen Miss  PERTINENT HISTORY: COPD, OP, DM   PAIN:  Are you having pain?   PRECAUTIONS: Fall  WEIGHT BEARING RESTRICTIONS No  FALLS: Has patient fallen in last 6 months? No  LIVING ENVIRONMENT: Lives with: lives with their family Lives in: House/apartment Stairs: internal with rails Has following equipment at home: Single point cane  PLOF: Independent  some cooking  PATIENT GOALS have less dizziness  OBJECTIVE:   COGNITION: Overall cognitive status: Within functional limits for tasks assessed   SENSATION: WFL  POSTURE: rounded shoulders and forward head   Cervical ROM:    Active A/PROM (deg) eval  Flexion WFL  Extension Decreased 50% with dizziness  Right lateral flexion Decreased 50% with dizziness  Left lateral flexion Decreased 50% with diziness  Right rotation Decreased 50% with diziness  Left rotation Decreased 50% with diziness  (Blank rows = not tested)  STRENGTH: LE 4/5     GAIT: Gait pattern: shuffling, tends to creep along furniture and walls Assistive device utilized: Single point cane  FUNCTIONAL TESTs:  5 times sit to stand: 46 seconds Timed up and go (TUG): 30 seconds creeps along furniture   VESTIBULAR ASSESSMENT   GENERAL OBSERVATION: off balance, uses furniture and walls to steady herself    SYMPTOM BEHAVIOR:   Non-Vestibular symptoms: headaches   Type of dizziness: Blurred Vision and Spinning/Vertigo   Frequency: all day everyday over the past 2 weeks  Duration: all day   Aggravating factors: Spontaneous, Induced by motion: turning head quickly, and Worse in the morning   Relieving factors: head stationary, dark room, closing eyes, rest, slow movements, and avoid busy/distracting environments   Progression of symptoms: better   OCULOMOTOR EXAM:   Ocular Alignment: normal   Ocular ROM: eye motions decreased 50% with some  nystagmus   Spontaneous Nystagmus: direction changing    Gaze-Induced Nystagmus: age appropriate nystagmus at end range   Smooth Pursuits: intact   Saccades: hypometric/undershoots      VESTIBULAR - OCULAR REFLEX:    Slow VOR: Normal and Positive Bilaterally   VOR Cancellation: Unable to Maintain Gaze       POSITIONAL TESTING: not tested     MOTION SENSITIVITY:  Unable to finish due to not feeling well with this    Motion Sensitivity Quotient  Intensity: 0 = none, 1 = Lightheaded, 2 = Mild, 3 = Moderate, 4 = Severe, 5 = Vomiting  Intensity  1. Sitting to supine 2  2. Supine to L side 2  3. Supine to R side 2  4. Supine to sitting 3  5. L Hallpike-Dix   6. Up from L    7. R Hallpike-Dix   8. Up from R    9. Sitting, head  tipped to L knee   10. Head up from L  knee   11. Sitting, head  tipped to R knee   12. Head up from R  knee   13. Sitting head turns x5   14.Sitting head nods x5   15. In stance, 180  turn to L    16. In stance, 180  turn to R     OTHOSTATICS: I did not test but had issues of a large drop from sitting to standing at the doctors office   VESTIBULAR TREATMENT:  Canalith Repositioning:    Habituation:gave VOR eye exercises to do in sitting    PATIENT EDUCATION: Education details: VOR sitting exercises Person educated: Patient Education method: Explanation, Demonstration, and Handouts Education comprehension: verbalized understanding   GOALS: Goals reviewed with patient? Yes  SHORT TERM GOALS: Target date: 11/16/21  Independent with initial HEP Goal status: INITIAL   LONG TERM GOALS: Target date: 01/25/22   Report dizziness decreased 50% Goal status: INITIAL  2.  Decrease TUG time to 18 seconds Goal status: INITIAL  3.  Decrease 5xSTS to 25 seconds Goal status: INITIAL  4.  Understand HEP and how it will help her symptoms Goal status: INITIAL  ASSESSMENT:  CLINICAL IMPRESSION: Patient is a 81 y.o. female who was seen  today for physical therapy evaluation and treatment for dizziness, she reports worse over the past few weeks, no specific cause, she reports movements of the head make it worse, she really limits the movements of her eyes as well as a coping mechanism, she reports that she the dizziness started she really has been in bed mostly because that is the only thing that helps, she is weak in the LE's.  She seemed to have some VOR issues, so I started with this for HEP, the interpreter helped with this..    OBJECTIVE IMPAIRMENTS Abnormal gait, decreased activity tolerance, decreased balance, decreased mobility, difficulty walking, decreased ROM, decreased strength, and dizziness.    REHAB POTENTIAL: Good  CLINICAL DECISION MAKING: Stable/uncomplicated  EVALUATION COMPLEXITY: Low   PLAN: PT FREQUENCY: 1-2x/week  PT DURATION: 12 weeks  PLANNED INTERVENTIONS: Therapeutic exercises, Therapeutic activity, Neuromuscular re-education, Balance training, Gait  training, Patient/Family education, Joint mobilization, Vestibular training, Canalith repositioning, Visual/preceptual remediation/compensation, and Manual therapy  PLAN FOR NEXT SESSION: assure that she can do the HEP and could pursue dix halpike activities   Jearld Lesch, PT 11/02/2021, 2:41 PM

## 2021-11-05 ENCOUNTER — Ambulatory Visit (INDEPENDENT_AMBULATORY_CARE_PROVIDER_SITE_OTHER): Payer: 59 | Admitting: Family Medicine

## 2021-11-05 ENCOUNTER — Encounter: Payer: Self-pay | Admitting: Family Medicine

## 2021-11-05 DIAGNOSIS — E1142 Type 2 diabetes mellitus with diabetic polyneuropathy: Secondary | ICD-10-CM

## 2021-11-05 DIAGNOSIS — R42 Dizziness and giddiness: Secondary | ICD-10-CM

## 2021-11-05 IMAGING — CT CT HEAD W/O CM
3 of 4 series · 15 of 47 positions shown, 18 images · non-contrast
Comparison: None.

CLINICAL DATA: Dizziness

EXAM:
CT HEAD WITHOUT CONTRAST
TECHNIQUE: Contiguous axial images were obtained from the base of the skull
through the vertex without intravenous contrast.

[Series 2: head 5.00 hr40 s3 axial ibhc · axial · 0.41mm/px · z∈[-591,-461]mm · 9 of 32 slices shown, 12 images]
[im 3/32  brain]
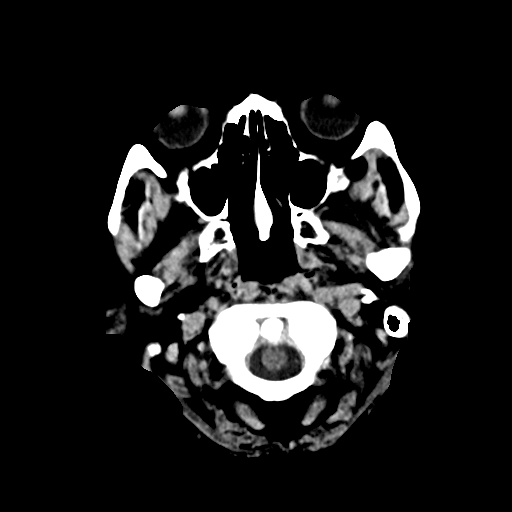
[im 3/32  bone]
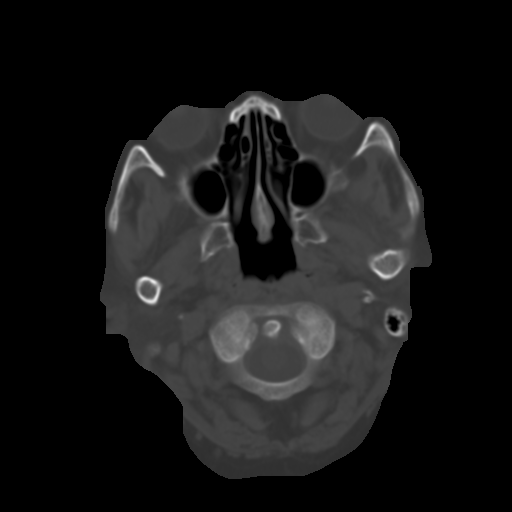
[im 7/32  brain]
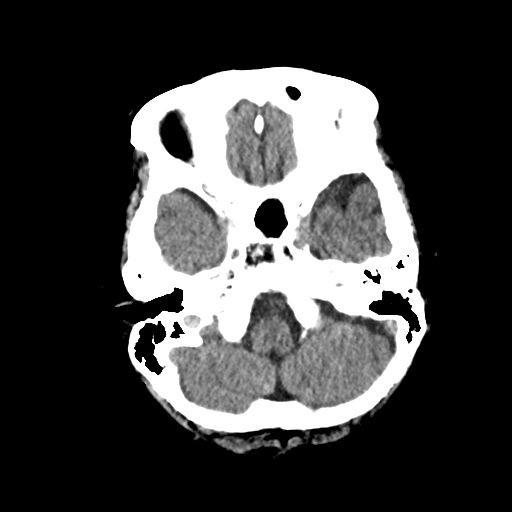
[im 9/32  brain]
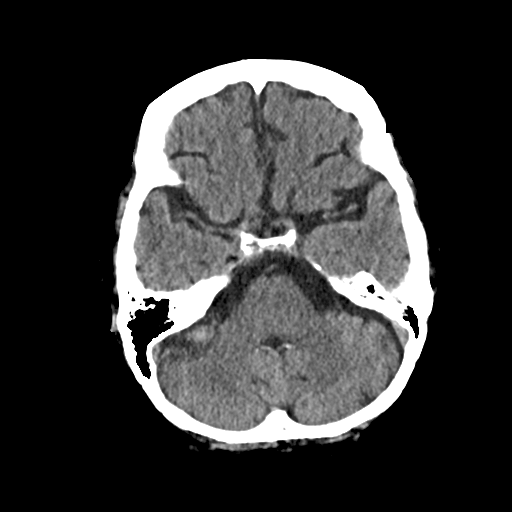
[im 14/32  brain]
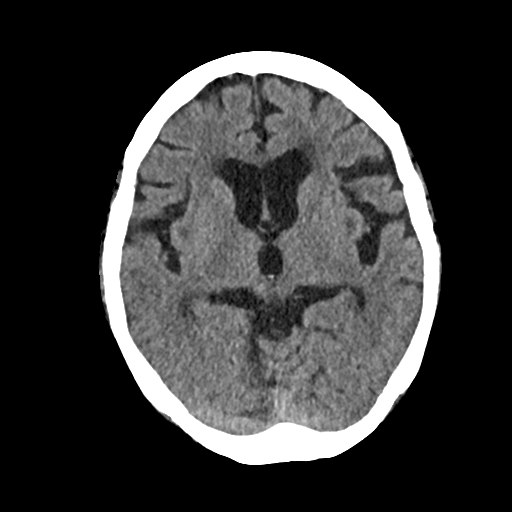
[im 16/32  brain]
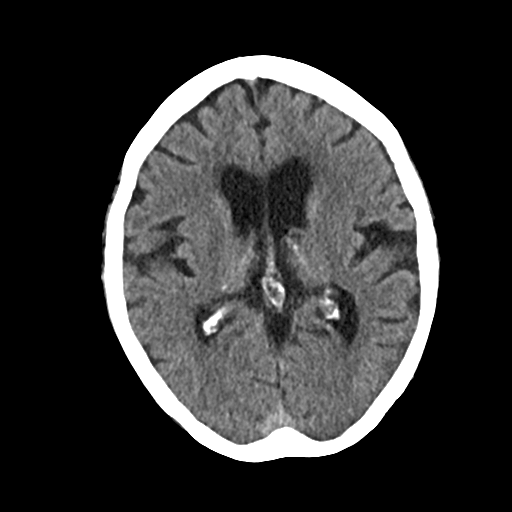
[im 16/32  bone]
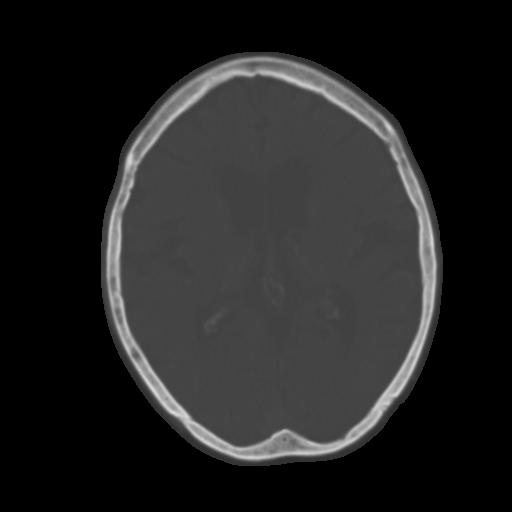
[im 18/32  brain]
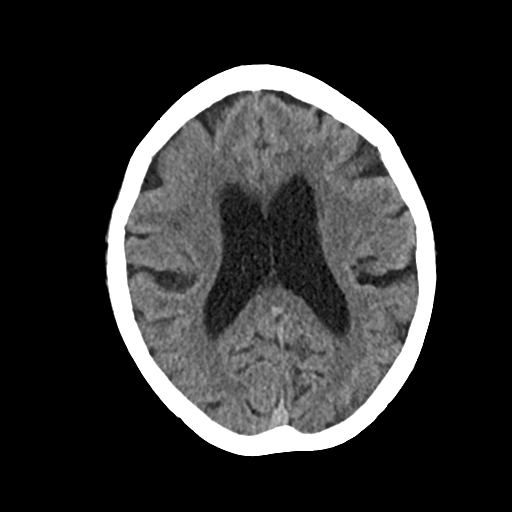
[im 23/32  brain]
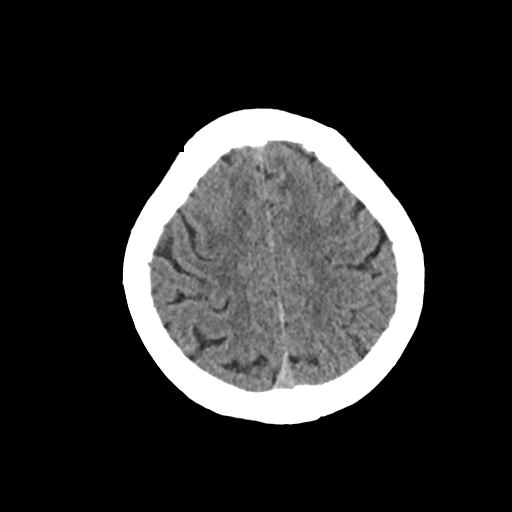
[im 25/32  brain]
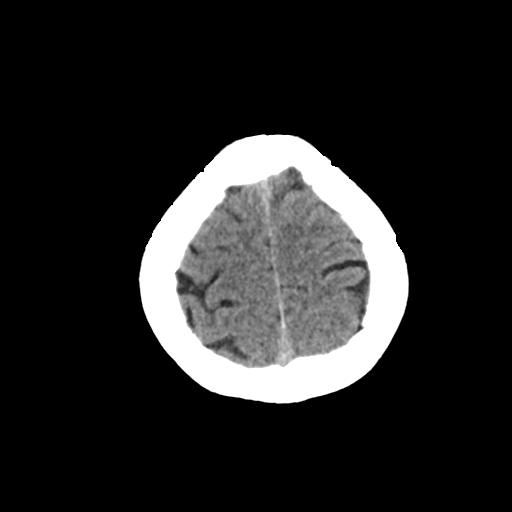
[im 29/32  brain]
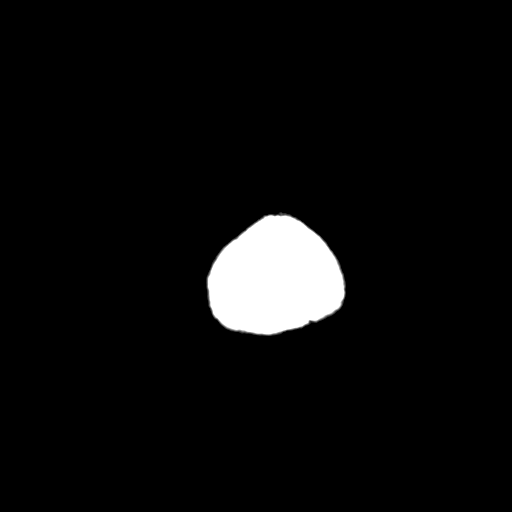
[im 29/32  bone]
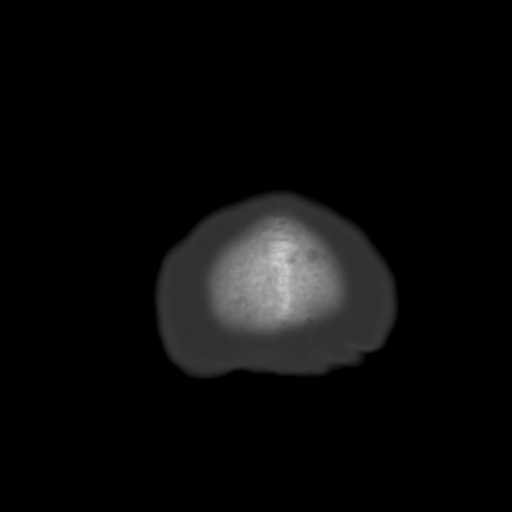

[Series 4: head 3.00 hr40 s3 sag · sagittal · 0.32mm/px · 3 of 69 slices shown]
[im 23/69  brain]
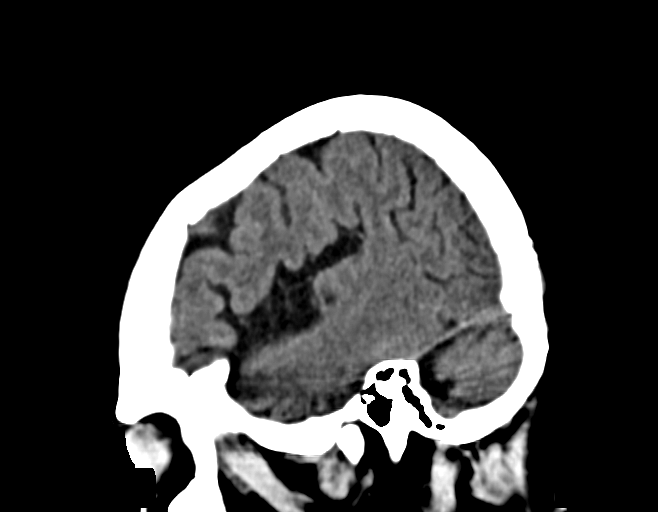
[im 35/69  brain]
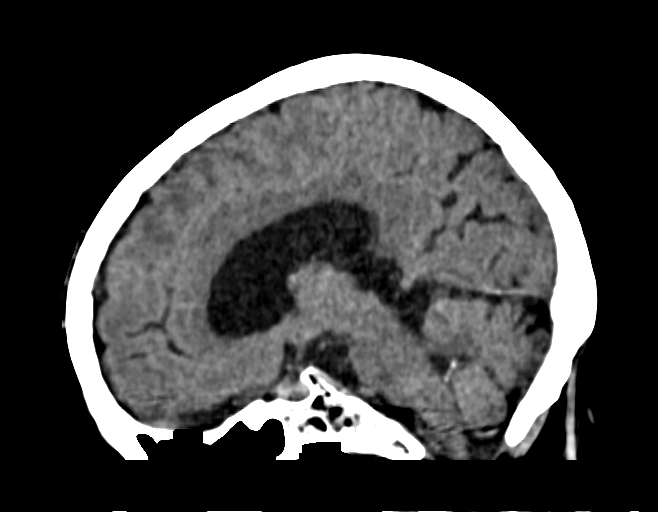
[im 46/69  brain]
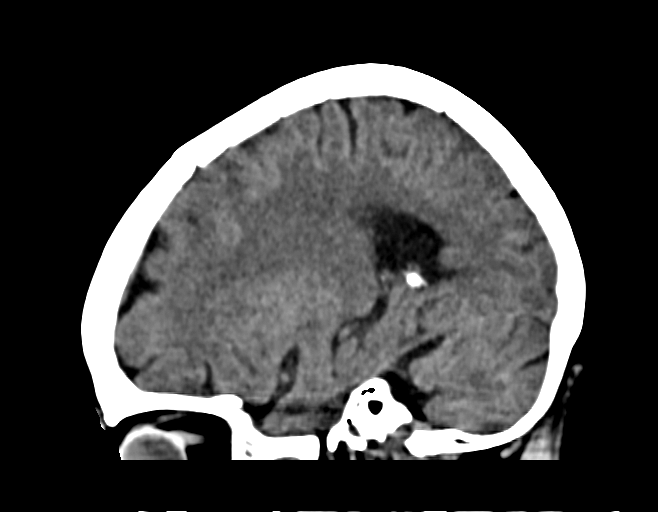

[Series 6: head 3.00 hr40 s3 cor · coronal · 0.32mm/px · 3 of 69 slices shown]
[im 23/69  brain]
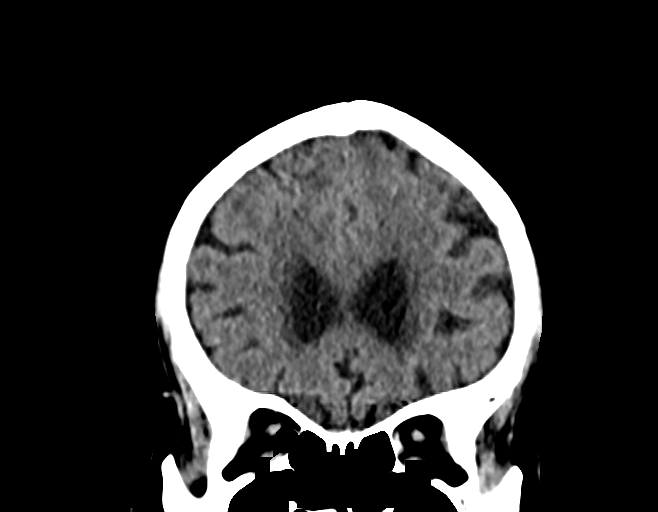
[im 31/69  brain]
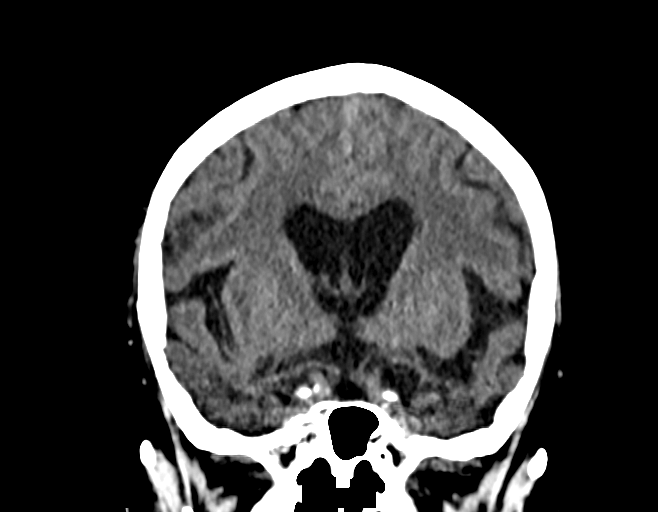
[im 38/69  brain]
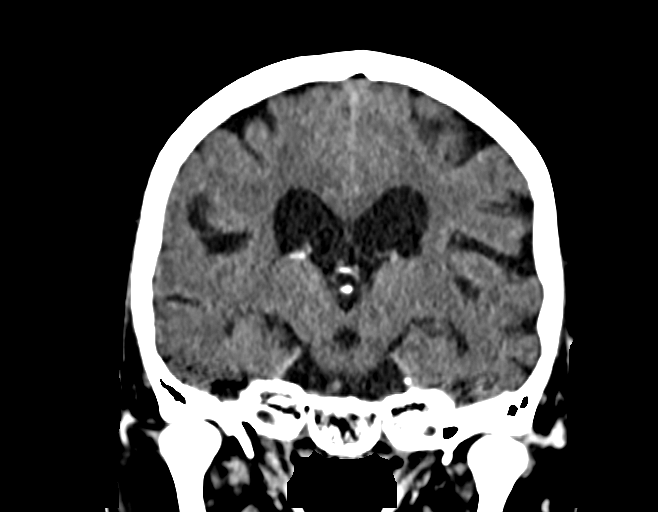

[15 of 47 positions shown; findings below may reference images not displayed]

FINDINGS: Brain: Normal anatomic configuration. Parenchymal volume loss is
commensurate with the patient's age. Mild periventricular white
matter changes are present likely reflecting the sequela of small
vessel ischemia. No abnormal intra or extra-axial mass lesion or
fluid collection. No abnormal mass effect or midline shift. No
evidence of acute intracranial hemorrhage or infarct. Ventricular
size is normal. Cerebellum unremarkable.

Vascular: No asymmetric hyperdense vasculature at the skull base.

Skull: Intact

Sinuses/Orbits: Paranasal sinuses are clear. Orbits are
unremarkable.

Other: Mastoid air cells and middle ear cavities are clear.
IMPRESSION: Mild senescent change.  No acute intracranial abnormality.

## 2021-11-05 MED ORDER — METFORMIN HCL ER 500 MG PO TB24: 1000.0000 mg | ORAL_TABLET | Freq: Every day | ORAL | 3 refills | Status: DC

## 2021-11-08 ENCOUNTER — Ambulatory Visit (INDEPENDENT_AMBULATORY_CARE_PROVIDER_SITE_OTHER): Payer: 59 | Admitting: Pharmacist

## 2021-11-08 ENCOUNTER — Encounter: Payer: Self-pay | Admitting: Pharmacist

## 2021-11-08 ENCOUNTER — Encounter: Payer: Self-pay | Admitting: Family Medicine

## 2021-11-08 DIAGNOSIS — R030 Elevated blood-pressure reading, without diagnosis of hypertension: Secondary | ICD-10-CM

## 2021-11-08 NOTE — Assessment & Plan Note (Signed)
A1c 8.6.  Currently asymptomatic and compliant with metformin 500 mg daily.  Discussed with patient that we could monitor that for the next 3 months with just diet alone given increasing age.  Patient will prefer to increase metformin. -Metformin increased to 1000 mg daily. -Discussed with patient and family to monitor for weight loss -Follow-up with repeat A1c in 3 months -Follow-up with PCP as needed.

## 2021-11-09 ENCOUNTER — Encounter: Payer: Self-pay | Admitting: Pharmacist

## 2021-11-09 NOTE — Progress Notes (Signed)
Reviewed: I agree with Dr. Koval's documentation and management. 

## 2021-11-12 ENCOUNTER — Ambulatory Visit: Payer: 59

## 2021-11-18 ENCOUNTER — Ambulatory Visit: Payer: 59 | Attending: Family Medicine

## 2021-11-18 DIAGNOSIS — R42 Dizziness and giddiness: Secondary | ICD-10-CM | POA: Insufficient documentation

## 2021-11-18 DIAGNOSIS — R262 Difficulty in walking, not elsewhere classified: Secondary | ICD-10-CM | POA: Insufficient documentation

## 2021-11-25 ENCOUNTER — Ambulatory Visit: Payer: 59

## 2021-11-25 DIAGNOSIS — R42 Dizziness and giddiness: Secondary | ICD-10-CM | POA: Diagnosis present

## 2021-11-25 DIAGNOSIS — R262 Difficulty in walking, not elsewhere classified: Secondary | ICD-10-CM

## 2021-11-25 NOTE — Therapy (Signed)
OUTPATIENT PHYSICAL THERAPY VESTIBULAR TREATMENT     Patient Name: Martha Gillespie MRN: [id number] DOB:30-Jul-1940, 81 y.o., female Today's Date: 11/25/2021  PCP: Carollee Leitz REFERRING PROVIDER: Carollee Leitz   PT End of Session - 11/25/21 1549     Visit Number 2    Date for PT Re-Evaluation 02/02/22    PT Start Time 6384    PT Stop Time 1623    PT Time Calculation (min) 38 min    Activity Tolerance Patient tolerated treatment well    Behavior During Therapy [facility] for tasks assessed/performed              Past Medical History:  Diagnosis Date   Abdominal pain 02/13/2012   Cough due to ACE inhibitor 05/05/2021   Diabetes mellitus ([place]) 02/15/2012   Dizziness 08/15/2019   Dysuria 05/05/2021   Eye pain, bilateral 06/20/2016   Hematuria 05/12/2021   Hypertension    URI with cough and congestion 06/03/2021   Weight loss 06/03/2020   Past Surgical History:  Procedure Laterality Date   ESOPHAGOGASTRODUODENOSCOPY  02/14/2012   Procedure: ESOPHAGOGASTRODUODENOSCOPY (EGD);  Surgeon: Beryle Beams, MD;  Location: [facility] ENDOSCOPY;  Service: Endoscopy;  Laterality: N/A;   Patient Active Problem List   Diagnosis Date Noted   Chronic nonintractable headache 06/03/2020   Dizziness 08/15/2019   Hearing loss 10/27/2018   Osteoporosis 07/31/2017   Memory problem 09/30/2016   Osteoarthritis of left knee 07/26/2016   Hyperlipidemia 06/20/2016   Neuropathic pain 06/20/2016   White coat syndrome with high blood pressure without hypertension 06/26/2014   Diabetes mellitus ([place]) 02/15/2012   COPD (chronic obstructive pulmonary disease) ([place]) 02/15/2012    ONSET DATE: 10/02/21  REFERRING DIAG: Dizziness  THERAPY DIAG:  Difficulty in walking, not elsewhere classified  Dizziness and giddiness  Rationale for Evaluation and Treatment Rehabilitation  SUBJECTIVE:   SUBJECTIVE STATEMENT: Patient reports that the dizziness has gotten better, and she does not really feel it that much anymore.  States she does not have any problems.    Pt accompanied by: interpreter: Ayesha Mohair  PERTINENT HISTORY: COPD, OP, DM   PAIN:  Are you having pain?   PRECAUTIONS: Fall  WEIGHT BEARING RESTRICTIONS No  FALLS: Has patient fallen in last 6 months? No  LIVING ENVIRONMENT: Lives with: lives with their family Lives in: House/apartment Stairs: internal with rails Has following equipment at home: Single point cane  PLOF: Independent  some cooking  PATIENT GOALS have less dizziness  OBJECTIVE:   COGNITION: Overall cognitive status: Within functional limits for tasks assessed   SENSATION: WFL  POSTURE: rounded shoulders and forward head   Cervical ROM:    Active A/PROM (deg) eval  Flexion WFL  Extension Decreased 50% with dizziness  Right lateral flexion Decreased 50% with dizziness  Left lateral flexion Decreased 50% with diziness  Right rotation Decreased 50% with diziness  Left rotation Decreased 50% with diziness  (Blank rows = not tested)  STRENGTH: LE 4/5     GAIT: Gait pattern: shuffling, tends to creep along furniture and walls Assistive device utilized: Single point cane  FUNCTIONAL TESTs:  5 times sit to stand: 46 seconds Timed up and go (TUG): 30 seconds creeps along furniture  TREATMENT Assess dizziness  LAQ 2# 2x10 HS curls greenTB 2x10 STS 2x10   OTHOSTATICS: I did not test but had issues of a large drop from sitting to standing at the doctors office    PATIENT EDUCATION: Education details: VOR sitting exercises Person educated: Patient Education  method: Explanation, Demonstration, and Handouts Education comprehension: verbalized understanding   GOALS: Goals reviewed with patient? Yes  SHORT TERM GOALS: Target date: 11/16/21  Independent with initial HEP Goal status: MET   LONG TERM GOALS: Target date: 01/25/22   Report dizziness decreased 50% Goal status: MET  2.  Decrease TUG time to 18 seconds Goal status: INITIAL  3.   Decrease 5xSTS to 25 seconds Goal status: INITIAL  4.  Understand HEP and how it will help her symptoms Goal status: INITIAL  ASSESSMENT:  CLINICAL IMPRESSION: Patient has met dizziness goal, she only feels it a little bit when standing up too fast. States that she had been practicing the VOR exercises at home. Patient is wheezing but reports that she always breathes like this, does not have history of asthma or breathing issues. We started some light LE exercises today, she requires longer rest breaks due to fatigue and SOB. SPO2 taken because of concerns with breath sounds, (90%, 92HR). She states she does not want to see her doctor for wheezing because she does not want to use an inhaler. With STS she is unable to control the descend once she begins to fatigue. Patient unable to tolerate full session and asked to end early due to fatigue. Her daughter will call back to schedule appointments but patient would like if she could just do them at home. She would benefit from progressive strengthening to LE to decrease fall risk.   OBJECTIVE IMPAIRMENTS Abnormal gait, decreased activity tolerance, decreased balance, decreased mobility, difficulty walking, decreased ROM, decreased strength, and dizziness.    REHAB POTENTIAL: Good  CLINICAL DECISION MAKING: Stable/uncomplicated  EVALUATION COMPLEXITY: Low   PLAN: PT FREQUENCY: 1-2x/week  PT DURATION: 12 weeks  PLANNED INTERVENTIONS: Therapeutic exercises, Therapeutic activity, Neuromuscular re-education, Balance training, Gait training, Patient/Family education, Joint mobilization, Vestibular training, Canalith repositioning, Visual/preceptual remediation/compensation, and Manual therapy  PLAN FOR NEXT SESSION: assure that she can do the HEP and could pursue dix halpike activities   [place], PT 11/25/2021, 4:26 PM

## 2021-12-06 ENCOUNTER — Other Ambulatory Visit: Payer: Self-pay

## 2021-12-06 DIAGNOSIS — E785 Hyperlipidemia, unspecified: Secondary | ICD-10-CM

## 2021-12-07 MED ORDER — METFORMIN HCL ER 500 MG PO TB24
1000.0000 mg | ORAL_TABLET | Freq: Every day | ORAL | 3 refills | Status: DC
Start: 2021-12-07 — End: 2022-03-18

## 2021-12-07 MED ORDER — ATORVASTATIN CALCIUM 40 MG PO TABS: 40.0000 mg | ORAL_TABLET | Freq: Every day | ORAL | 3 refills | Status: DC

## 2021-12-08 ENCOUNTER — Ambulatory Visit
Admission: RE | Admit: 2021-12-08 | Discharge: 2021-12-08 | Disposition: A | Discharge status: Home / Self Care | Payer: Medicaid Other | Source: Ambulatory Visit | Attending: Family Medicine | Admitting: Family Medicine

## 2021-12-08 DIAGNOSIS — M810 Age-related osteoporosis without current pathological fracture: Secondary | ICD-10-CM

## 2022-02-17 ENCOUNTER — Other Ambulatory Visit: Payer: Self-pay | Admitting: Family Medicine

## 2022-02-17 DIAGNOSIS — E1142 Type 2 diabetes mellitus with diabetic polyneuropathy: Secondary | ICD-10-CM

## 2022-02-17 DIAGNOSIS — I10 Essential (primary) hypertension: Secondary | ICD-10-CM

## 2022-03-11 ENCOUNTER — Ambulatory Visit: Payer: 59 | Admitting: Family Medicine

## 2022-03-18 ENCOUNTER — Encounter: Payer: Self-pay | Admitting: Family Medicine

## 2022-03-18 ENCOUNTER — Ambulatory Visit (INDEPENDENT_AMBULATORY_CARE_PROVIDER_SITE_OTHER): Payer: 59 | Admitting: Family Medicine

## 2022-03-18 VITALS — BP 156/87 | HR 89 | Wt 142.0 lb

## 2022-03-18 DIAGNOSIS — E1142 Type 2 diabetes mellitus with diabetic polyneuropathy: Secondary | ICD-10-CM | POA: Diagnosis not present

## 2022-03-18 DIAGNOSIS — H9192 Unspecified hearing loss, left ear: Secondary | ICD-10-CM

## 2022-03-18 LAB — POCT GLYCOSYLATED HEMOGLOBIN (HGB A1C): HbA1c, POC (controlled diabetic range): 10.4 % — AB (ref 0.0–7.0)

## 2022-03-18 MED ORDER — METFORMIN HCL ER 500 MG PO TB24: 1000.0000 mg | ORAL_TABLET | Freq: Two times a day (BID) | ORAL | 3 refills | Status: DC

## 2022-03-18 NOTE — Assessment & Plan Note (Signed)
A1c 10.4 up from 8.6 four months ago.  Currently on Metformin 1000 mg daily. Increased today to Metformin 1000 mg BID.  Discussed dietary changes as well.  We will follow-up in about a month and will likely start an additional agent at that time possibly an SGLT2 recommended bringing in all of her medication at next visit.

## 2022-03-18 NOTE — Patient Instructions (Signed)
It was great seeing you today!  Your sugars were elevated at 10.6 up from 8.4 a few months ago  I am going to increase her metformin to 1000 mg twice a day and I would like to follow-up in few weeks to see how she is tolerating that and we may want to start another medication called an SGLT2.  Continue with dietary changes, eating lots of vegetables and reducing sugar and starch.   Please check-out at the front desk before leaving the clinic. I'd like to see her back in about 2-3 weeks and please bring all her medication at that time, but if you need to be seen earlier than that for any new issues we're happy to fit you in, just give [place] a call!  Feel free to call with any questions or concerns at any time, at [phone].   Take care,  Dr. Shary Key [facility]

## 2022-03-18 NOTE — Progress Notes (Signed)
    SUBJECTIVE:   CHIEF COMPLAINT / HPI:   Patient presents for diabetes follow up   Would like a referral for a hearing test. Feeels that she hears less from both ears and also has ringing. Has been ongoing for years but now worsens.  Denies pain.    DM2: Takes Metformin 1000mg  daily. Does endorse increased peripheral neuropathy   HTN- daughter believes she takes Losartan 25mg    PERTINENT  PMH / PSH: Reviewed   OBJECTIVE:   BP (!) 156/87   Pulse 89   Wt 142 lb (64.4 kg)   SpO2 95%   BMI 25.15 kg/m    Physical exam General: well appearing, NAD HEENT: TM normal bilaterally  Cardiovascular: RRR, no murmurs Lungs: CTAB. Normal WOB Abdomen: soft, non-distended, non-tender Skin: warm, dry. No edema  ASSESSMENT/PLAN:   No problem-specific Assessment & Plan notes found for this encounter.   Type 2 Diabetes  A1c 10.4 up from 8.6 four months ago.  Currently on Metformin 1000 mg daily. Increased today to Metformin 1000 mg BID.  Discussed dietary changes as well.  We will follow-up in about a month and will likely start an additional agent at that time possibly an SGLT2 recommended bringing in all of her medication at next visit.  White coat HTN BP 156/87. Was previously evaluated and was deemed not to have true HTN. Will not make any adjustments at this time   Hearing loss Patient endorses worsening of her hearing loss and tinnitus.  Placed ENT referral per family's request for further evaluation.  [place]

## 2022-04-12 ENCOUNTER — Ambulatory Visit (INDEPENDENT_AMBULATORY_CARE_PROVIDER_SITE_OTHER): Payer: 59 | Admitting: Family Medicine

## 2022-04-12 ENCOUNTER — Encounter: Payer: Self-pay | Admitting: Family Medicine

## 2022-04-12 VITALS — BP 120/70 | HR 74 | Ht 63.0 in | Wt 142.0 lb

## 2022-04-12 DIAGNOSIS — R062 Wheezing: Secondary | ICD-10-CM | POA: Diagnosis not present

## 2022-04-12 DIAGNOSIS — E1142 Type 2 diabetes mellitus with diabetic polyneuropathy: Secondary | ICD-10-CM | POA: Diagnosis not present

## 2022-04-12 DIAGNOSIS — Z23 Encounter for immunization: Secondary | ICD-10-CM | POA: Diagnosis not present

## 2022-04-12 MED ORDER — ALBUTEROL SULFATE HFA 108 (90 BASE) MCG/ACT IN AERS: 2.0000 | INHALATION_SPRAY | Freq: Four times a day (QID) | RESPIRATORY_TRACT | 2 refills | Status: DC | PRN

## 2022-04-12 NOTE — Progress Notes (Unsigned)
    SUBJECTIVE:   CHIEF COMPLAINT / HPI:   Patient presents with daughter for diabetes follow up. Last visit A1c 10.4 increased Metformin 1000 Fasting blood sugars in the 130s  Did not bring in other medications  Has been eating more vegatables, fish and eggs and no red meat. Sometimes will have some brown rice   Also wanted to follow up on ENT referral for hearing evaluation   PERTINENT  PMH / PSH: Reviews   OBJECTIVE:   BP 120/70   Pulse 74   Ht 5\' 3"  (1.6 m)   Wt 142 lb (64.4 kg)   SpO2 97%   BMI 25.15 kg/m    Physical exam General: well appearing, NAD Cardiovascular: RRR, no murmurs Lungs: Diffuse end expiratory wheezing. Normal WOB Abdomen: soft, non-distended, non-tender Skin: warm, dry. No edema  ASSESSMENT/PLAN:   No problem-specific Assessment & Plan notes found for this encounter.   DM2 Last A1c on 11/3 was 10.4. Last visit increased Metfromin to 1000mg  BID. Tolerating well.  Fasting sugars have been in the 130s, so I will not add an additional agent at this time. Will recheck in 2 months   Some wheezing on exam and daughter also states sometimes she wheezes   Health maintenance COVID vaccine   13/3, DO [facility]

## 2022-04-12 NOTE — Patient Instructions (Addendum)
It was great seeing you today!  Continue Metformin 1000 mg twice a day and we will check A1c in 2 months to see if we need to add on medication  I have sent in albuterol to use 2 puffs as needed for wheezing or shortness of breath. If she needs this multiple times a week let me know and we will evaluate for need for a maintenance inhaler daily.   For her hearing test the referral was placed to: [facility] Address: [address] [place], [place], [place] [address] Phone: [phone] You can call to schedule an appointment   Please check-out at the front desk before leaving the clinic. I'd like to see you back in 2 months for next A1c check, but if you need to be seen earlier than that for any new issues we're happy to fit you in, just give [place] a call!  Visit Reminders: - Stop by the pharmacy to pick up your prescriptions  - Continue to work on your healthy eating habits and incorporating exercise into your daily life.   Feel free to call with any questions or concerns at any time, at [phone].   Take care,  Dr. Cora Collum [facility]

## 2022-04-13 DIAGNOSIS — R062 Wheezing: Secondary | ICD-10-CM | POA: Insufficient documentation

## 2022-04-13 NOTE — Assessment & Plan Note (Signed)
Last A1c on 11/3 was 10.4. Last visit increased Metfromin to 1000mg  BID. Tolerating well.  Fasting sugars have been in the 130s, so I will not add an additional agent at this time. Will recheck in 2 months.

## 2022-04-13 NOTE — Assessment & Plan Note (Signed)
On exam patient with diffuse expiratory wheezing and daughter states she has heard her wheezing at times at home. Has not used an inhaler. Prescribing Albuterol as needed for SOB or wheezing. Discussed letting me know if she needs it weekly and if so will discuss maintenance inhaler/PFTs.

## 2022-05-31 ENCOUNTER — Ambulatory Visit (INDEPENDENT_AMBULATORY_CARE_PROVIDER_SITE_OTHER): Payer: 59 | Admitting: Student

## 2022-05-31 VITALS — BP 139/70 | HR 104 | Temp 99.7°F | Wt 140.0 lb

## 2022-05-31 DIAGNOSIS — J439 Emphysema, unspecified: Secondary | ICD-10-CM | POA: Diagnosis not present

## 2022-05-31 DIAGNOSIS — R051 Acute cough: Secondary | ICD-10-CM

## 2022-05-31 MED ORDER — BENZONATATE 100 MG PO CAPS: 100.0000 mg | ORAL_CAPSULE | Freq: Two times a day (BID) | ORAL | 0 refills | Status: AC | PRN

## 2022-05-31 MED ORDER — ANORO ELLIPTA 62.5-25 MCG/ACT IN AEPB: 1.0000 | INHALATION_SPRAY | Freq: Every day | RESPIRATORY_TRACT | 2 refills | Status: DC

## 2022-05-31 NOTE — Patient Instructions (Addendum)
It was wonderful to meet you today. Thank you for allowing me to be a part of your care. Below is a short summary of what we discussed at your visit today:  Your symptoms are consistent with COPD exacerbation likely due to current viral illness.  Have sent in prescription for an inhaler which she will use daily, it is called Anoro elliptia.  Your albuterol will be as needed for worsening cough or wheezing  For cough I have sent in prescription for Tessalon Perle which you will take for 5 days.  Can also do warm water, honey and lemon.  Follow-up in 3 days.  Please bring all of your medications to every appointment!  If you have any questions or concerns, please do not hesitate to contact us via phone or MyChart message.   Alen Bleacher, MD [facility]

## 2022-05-31 NOTE — Assessment & Plan Note (Addendum)
Suspect patient is experiencing COPD exacerbation likely in the setting of acute viral illness given her report of cough, subjective fever, chills and fatigue.  Also of note patient is still indulging in tobacco usage which could also exacerbate her COPD. Initial O2 sats today was 90% and on repeat improved to 95%.  On exam has diffuse expiratory wheezing with normal work of breathing.  Does have history of diabetes and last A1c show poorly controlled diabetes with A1c of 10.2.  Although she could benefit from steroid treatment, will avoid steroid at this time given poorly controlled diabetes.  She would likely benefit from a PFT study eventually. -Rx Anoro Ellipta -Rx Tessalon Perle for cough -Encourage hydration due to concerns of viral illness -Stressed the need for smoking cessation -Continue rescue albuterol as needed -Follow-up in 3 days, we will consider checks x-ray/thyroid treatment with worsening symptoms.

## 2022-05-31 NOTE — Progress Notes (Signed)
    SUBJECTIVE:   CHIEF COMPLAINT / HPI:   Patient is an 82 year old female with history of COPD presenting for wheezing and cough.  Accompany by daughter today who reports symptoms of cough started yesterday and wheezing has gotten worse since the start of cough. Appears to be more fatigued than usual.  She recently started her albuterol inhaler about 2 week ago for COPD.Marland Kitchen  Cough is nonproductive.  Reports subjective fever and chills but no chest pain. Patient   PERTINENT  PMH / PSH: Reviewed   OBJECTIVE:   BP 139/70   Pulse (!) 104   Temp 99.7 F (37.6 C) (Oral)   Wt 140 lb (63.5 kg)   SpO2 90%   BMI 24.80 kg/m  \     05/31/2022    3:38 PM 04/12/2022   10:15 AM 03/18/2022    9:48 AM  PHQ9 SCORE ONLY  PHQ-9 Total Score 16 13 7      Physical Exam General: Alert, well appearing HEENT: Unable to visualize oropharynx, no cervical lymphadenopathy. Cardiovascular: RRR, No Murmurs, Normal S2/S2 Respiratory: CTAB with diffuse expiratory wheezing, normal work of breathing. Extremities: No edema on extremities     ASSESSMENT/PLAN:   COPD (chronic obstructive pulmonary disease) Suspect patient is experiencing COPD exacerbation likely in the setting of acute viral illness given her report of cough, subjective fever, chills and fatigue.  Also of note patient is still indulging in tobacco usage which could also exacerbate her COPD. Initial O2 sats today was 90% and on repeat improved to 95%.  On exam has diffuse expiratory wheezing with normal work of breathing.  Does have history of diabetes and last A1c show poorly controlled diabetes with A1c of 10.2.  Although she could benefit from steroid treatment, will avoid steroid at this time given poorly controlled diabetes.  She would likely benefit from a PFT study eventually. -Rx Anoro Ellipta -Rx Tessalon Perle for cough -Encourage hydration due to concerns of viral illness -Stressed the need for smoking cessation -Continue rescue  albuterol as needed -Follow-up in 3 days, we will consider checks x-ray/thyroid treatment with worsening symptoms.   Depressed mood Patient's PHQ-9 today was 16.  No active SI/HI.  Attempted to discuss counseling and the need for management however daughter expressed they have no interest in treatment and see bo need for that at this time.    Alen Bleacher, MD [place]

## 2022-06-01 ENCOUNTER — Other Ambulatory Visit: Payer: Self-pay | Admitting: Family Medicine

## 2022-06-03 ENCOUNTER — Ambulatory Visit (INDEPENDENT_AMBULATORY_CARE_PROVIDER_SITE_OTHER): Payer: 59 | Admitting: Student

## 2022-06-03 ENCOUNTER — Encounter: Payer: Self-pay | Admitting: Student

## 2022-06-03 VITALS — BP 122/68 | HR 99 | Ht 63.0 in | Wt 139.0 lb

## 2022-06-03 DIAGNOSIS — J441 Chronic obstructive pulmonary disease with (acute) exacerbation: Secondary | ICD-10-CM | POA: Diagnosis not present

## 2022-06-03 DIAGNOSIS — J439 Emphysema, unspecified: Secondary | ICD-10-CM | POA: Diagnosis not present

## 2022-06-03 MED ORDER — ALBUTEROL SULFATE (2.5 MG/3ML) 0.083% IN NEBU
2.5000 mg | INHALATION_SOLUTION | Freq: Once | RESPIRATORY_TRACT | Status: AC
  Administered 2022-06-03: 2.5 mg via RESPIRATORY_TRACT

## 2022-06-03 MED ORDER — PREDNISONE 20 MG PO TABS: 40.0000 mg | ORAL_TABLET | Freq: Every day | ORAL | 0 refills | Status: AC

## 2022-06-03 MED ORDER — IPRATROPIUM-ALBUTEROL 0.5-2.5 (3) MG/3ML IN SOLN: 3.0000 mL | RESPIRATORY_TRACT | 0 refills | Status: DC | PRN

## 2022-06-03 MED ORDER — IPRATROPIUM BROMIDE 0.02 % IN SOLN
0.5000 mg | Freq: Once | RESPIRATORY_TRACT | Status: AC
  Administered 2022-06-03: 0.5 mg via RESPIRATORY_TRACT

## 2022-06-03 NOTE — Patient Instructions (Addendum)
It was wonderful to meet you today. Thank you for allowing me to be a part of your care. Below is a short summary of what we discussed at your visit today:  Your mom is most likely having COPD exacerbation.  I have sent in prescription for oral steroid which she will take daily for 5 days.  Also I have sent in nebulizer treatments which she will also use for your asthma exacerbation.   Follow up in one week or earlier as needed   If you have any questions or concerns, please do not hesitate to contact us via phone or MyChart message.   Alen Bleacher, MD [facility]

## 2022-06-03 NOTE — Progress Notes (Signed)
    SUBJECTIVE:   CHIEF COMPLAINT / HPI:   Video InterpreterSteele Gillespie [phone]  Patient is an 82 year old female with history of tobacco use presenting for COPD exacerbation follow-up.Recently seen for worsening cough and wheezing. Likely COPD exacerbation in setting of Viral URI and tobacco use.  At last visits was started on [facility] but daughter said they were not able to start inhaler due to pharmacy not having the inhaler in stock. Daughter report no change, she still feels the same with the cough and wheezing. No increased work of breathing but think she seldomly will have increased respiratory rate. Daughter reports patient is still  not eating as much but believes she's been drinking enough.   PERTINENT  PMH / PSH: Reviewed  OBJECTIVE:   BP 122/68   Pulse 99   Ht 5\' 3"  (1.6 m)   Wt 139 lb (63 kg)   SpO2 91%   BMI 24.62 kg/m    Physical Exam General: Alert, fatigued appearing, NAD, Oriented x3 Cardiovascular: RRR, No Murmurs, Normal S2/S2 Respiratory: CTAB with diffused inspiratory and expiratory wheezing.  Mildly tachypneic with normal work of breathing Abdomen: No distension or tenderness Extremities: No edema on extremities     ASSESSMENT/PLAN:   COPD (chronic obstructive pulmonary disease) Patient's history and exam findings are consistent with COPD exacerbations in the setting of current viral URI and continued tobacco use.  O2 sats initially was 91% and with ambulation dropped to 88%.  She was started on nebulizer treatment in office, O2 stats improved to low 90s with treatments.  In concerns of COPD exacerbations without green sputum production with cough we will treat patient's weight steroid and nebulizer treatment.  O2 sats goals for COPD patients 88-92% -Rx prednisone 40 mg for 5 days -Rx DuoNeb nebulizer every 4 hours as needed -Continue albuterol as needed -Reviewed return precautions with patient and daughter who verbalized understanding and agreement to  plan.     Alen Bleacher, MD [place]

## 2022-06-03 NOTE — Assessment & Plan Note (Signed)
Patient's history and exam findings are consistent with COPD exacerbations in the setting of current viral URI and continued tobacco use.  O2 sats initially was 91% and with ambulation dropped to 88%.  She was started on nebulizer treatment in office, O2 stats improved to low 90s with treatments.  In concerns of COPD exacerbations without green sputum production with cough we will treat patient's weight steroid and nebulizer treatment.  O2 sats goals for COPD patients 88-92% -Rx prednisone 40 mg for 5 days -Rx DuoNeb nebulizer every 4 hours as needed -Continue albuterol as needed -Reviewed return precautions with patient and daughter who verbalized understanding and agreement to plan.

## 2022-06-14 ENCOUNTER — Ambulatory Visit: Payer: 59 | Admitting: Family Medicine

## 2022-06-21 ENCOUNTER — Ambulatory Visit (INDEPENDENT_AMBULATORY_CARE_PROVIDER_SITE_OTHER): Payer: 59 | Admitting: Family Medicine

## 2022-06-21 ENCOUNTER — Encounter: Payer: Self-pay | Admitting: Family Medicine

## 2022-06-21 VITALS — BP 120/70 | HR 93 | Ht 63.0 in | Wt 142.8 lb

## 2022-06-21 DIAGNOSIS — E1142 Type 2 diabetes mellitus with diabetic polyneuropathy: Secondary | ICD-10-CM

## 2022-06-21 LAB — POCT GLYCOSYLATED HEMOGLOBIN (HGB A1C): HbA1c, POC (controlled diabetic range): 8 % — AB (ref 0.0–7.0)

## 2022-06-21 NOTE — Progress Notes (Unsigned)
    SUBJECTIVE:   CHIEF COMPLAINT / HPI:   Patient presents with daughter for diabetes follow up. Daughter interpreting. Denies any concerns. Feels well. Denies nausea, polyuria, polydipsia. Currently taking Metformin 1000mg  twice a day.   PERTINENT  PMH / PSH: Reviewed   OBJECTIVE:   BP 120/70   Pulse 93   Ht 5\' 3"  (1.6 m)   Wt 142 lb 12.8 oz (64.8 kg)   SpO2 90%   BMI 25.30 kg/m   General: alert, NAD CV: RRR no murmurs Resp: CTAB normal WOB GI: soft, non distended Derm: warm, dry. No LE edema   ASSESSMENT/PLAN:   Diabetes mellitus A1c 8.0 down from 10.4 three months ago. Currently on Metformin 1000mg  BID. Denies symptoms of hyperglycemia. Will continue current regimen and follow up in 3 months.     [place]

## 2022-06-21 NOTE — Patient Instructions (Signed)
It was great seeing you today!  Your A1c went down from 10.4 to 8. Great job! Continue the metformin twice daily.   We will see you back in 3 months for your next diabetes follow up, but if you need to be seen earlier than that for any new issues we're happy to fit you in, just give [place] a call!  Feel free to call with any questions or concerns at any time, at [phone].   Take care,  Dr. Shary Key [facility]

## 2022-06-23 NOTE — Assessment & Plan Note (Signed)
A1c 8.0 down from 10.4 three months ago. Currently on Metformin 1000mg  BID. Denies symptoms of hyperglycemia. Will continue current regimen and follow up in 3 months.

## 2022-07-02 ENCOUNTER — Other Ambulatory Visit: Payer: Self-pay | Admitting: Student

## 2022-07-02 DIAGNOSIS — J441 Chronic obstructive pulmonary disease with (acute) exacerbation: Secondary | ICD-10-CM

## 2022-09-20 ENCOUNTER — Ambulatory Visit (INDEPENDENT_AMBULATORY_CARE_PROVIDER_SITE_OTHER): Payer: 59 | Admitting: Student

## 2022-09-20 VITALS — BP 118/62 | HR 83 | Wt 145.4 lb

## 2022-09-20 DIAGNOSIS — R300 Dysuria: Secondary | ICD-10-CM | POA: Diagnosis not present

## 2022-09-20 LAB — POCT UA - MICROSCOPIC ONLY

## 2022-09-20 LAB — POCT URINALYSIS DIP (MANUAL ENTRY)
Bilirubin, UA: NEGATIVE
Glucose, UA: 100 mg/dL — AB
Ketones, POC UA: NEGATIVE mg/dL
Nitrite, UA: NEGATIVE
Protein Ur, POC: NEGATIVE mg/dL
Spec Grav, UA: 1.015 (ref 1.010–1.025)
Urobilinogen, UA: 0.2 E.U./dL
pH, UA: 7 (ref 5.0–8.0)

## 2022-09-20 MED ORDER — CEPHALEXIN 500 MG PO CAPS: 500.0000 mg | ORAL_CAPSULE | Freq: Two times a day (BID) | ORAL | 0 refills | Status: AC

## 2022-09-20 NOTE — Assessment & Plan Note (Signed)
Dysuria, suprapubic pain and tenderness, low back pain, afebrile. CVA's nontender. UA: Leukocyte esterase & blood positive, nitrite negative ,which is usual compared to past UTI's. Will treat with Keflex 500 BID for 7 days. Gave return precautions.

## 2022-09-20 NOTE — Patient Instructions (Addendum)
?? ??????? ????? ?????? ??????! ??????? ???????? ???????? ???? ??? ????????? ???? ???? ??????????? ???????? ??????? ???????? ????? ?????? ?????? ?????? ?  ?? ?????? ??????? ??????:   1. ??????? UTI ?? ???? 7 ????? ???? ????? ??? ??? ???????????? ???? ?????? ????? ??? ?? ?????? ?????? ?? ?????? ??????, ????? ???: ???????????? ???? ???????????  ??? ??????? ????? ?? ????????? ??? ???, ????? ????? ????????? ? ????? ???????? ?????? ??????????? ?????? ???? ????? ????? ???? ???? ??????? ???? ???? ?? ??????????  ???????? ???????? ?? ????? ???? ???? ???? ??????????? ??? ?? ??????????? ????? ????????? ???? ????? ??????? ???????????? 15 ????? ??? ????????????? ???? ?? ?????? ??????? ???????? ??? ??????? ?ja tim?l?'? d?kh?ra r?mr? l?gy?! Tap?'??k? pr?thamika h?rac?hak? l?gi k?na p?riv?rika au?adhi chanau?a garnubha'?k?m? dhan'yav?da. Dilam?y? t?m??al?'? pis?ba l?gd? dukh?k? d?khiy?.  ?ja h?m?l? samb?dhana garyau?: 1. Tap?'?l?'? UTI cha. Mail? 7 dinak? l?gi dinam? du'? pa?aka ?n?ib?y??ika lin? sall?ha di'?m?. Yadi y? sam?dh?na hum?daina v? phirt? hum?daina, kr?pay? puna: M?ly??kanak? l?gi pharkanuh?s.  Yadi tap?'?nl? pahil? nai garnubha'?k? chaina bhan?, ?phn? ly?ba pari??mahar? ra ?phn? pr?thamika h?rac?ha cikitsakasam?ga sac?ra garna sajil? pahum?ca garna m?r? c?r?ak? l?gi s?'ina apa garnuh?s.  Lak?a?ahar? bigriy? v? sudh?ra garna asaphala bha'?m? pharkanuh?s. C?ka ina prakriy?m? sahajat? suni?cita garna kr?pay? tap?'im?k? ap?'in?am?n?a 15 min??a aghi ?'ipugnuh?s. H?m? y? garnam? tap?'??k? pray?sak? kadara garchau?.  It was great to see you today! Thank you for choosing [facility] for your primary care. Martha Gillespie was seen for pain with urination.  Today we addressed: You have a UTI.  I prescribed an antibiotic to take twice a day for 7 days.  Should this not resolve or return, please return for reevaluation.  If you haven't already, sign up for My Chart to have easy access to your labs  results, and communication with your primary care physician.  Return if symptoms worsen or fail to improve. Please arrive 15 minutes before your appointment to ensure smooth check in process.  We appreciate your efforts in making this happen.  Thank you for allowing me to participate in your care, [name], DO 09/20/2022, 4:46 PM PGY-2, [facility]

## 2022-09-20 NOTE — Progress Notes (Unsigned)
  Date of Visit: 09/20/2022   SUBJECTIVE:   HPI:  HPI obtained using daughter who translates. Declined Nepali interpreter. Martha Gillespie is an 82 year old female w/ PMHx of COPD, HTN, T2DM, HLD, SNHL, chronic headache, smoking who presents today for lower abdominal pain and dysuria with burning during urination. She does not have abdominal pain outside of urination. She reports increased urinary frequency and lower back pain that is primarily centrally as well. The patient denies hematuria, fever, nausea, vomiting, diarrhea, or changes to bowel movements. Her daughter reports that she has has multiple UTI's in the prior years that have been well treated with antibiotics.  OBJECTIVE:  BP 118/62   Pulse 83   Wt 145 lb 6.4 oz (66 kg)   SpO2 91%   BMI 25.76 kg/m  Gen: Well appearing HEENT: Requires loud voice from daughter to hear during examination and interview Heart: Regular rate and rhythm. Abdomen: Tenderness to the suprapubic region. The rest of the abdomen is nontender. MSK: Nontender CVA's bilaterally.  ASSESSMENT/PLAN:  Dysuria Assessment & Plan: Dysuria, suprapubic pain and tenderness, low back pain, afebrile. CVA's nontender. UA: Leukocyte esterase & blood positive, nitrite negative ,which is usual compared to past UTI's. Will treat with Keflex 500 BID for 7 days. Gave return precautions.  Orders: -     POCT urinalysis dipstick -     POCT urinalysis dipstick -     POCT UA - Microscopic Only -     Cephalexin; Take 1 capsule (500 mg total) by mouth 2 (two) times daily for 7 days.  Dispense: 14 capsule; Refill: 0  Follow up as needed.  Kayleen Memos [facility]  I was personally present and performed or re-performed the history, physical exam and medical decision making activities of this service and have verified that the service and findings are accurately documented in the student's note.  [name], DO                  09/21/2022, 9:24 AM

## 2022-11-01 ENCOUNTER — Other Ambulatory Visit: Payer: Self-pay

## 2022-11-01 ENCOUNTER — Encounter: Payer: Self-pay | Admitting: Family Medicine

## 2022-11-01 ENCOUNTER — Ambulatory Visit (INDEPENDENT_AMBULATORY_CARE_PROVIDER_SITE_OTHER): Payer: 59 | Admitting: Family Medicine

## 2022-11-01 VITALS — BP 126/62 | HR 86 | Ht 63.0 in | Wt 148.2 lb

## 2022-11-01 DIAGNOSIS — R42 Dizziness and giddiness: Secondary | ICD-10-CM | POA: Diagnosis not present

## 2022-11-01 DIAGNOSIS — H538 Other visual disturbances: Secondary | ICD-10-CM

## 2022-11-01 DIAGNOSIS — E1142 Type 2 diabetes mellitus with diabetic polyneuropathy: Secondary | ICD-10-CM

## 2022-11-01 LAB — POCT GLYCOSYLATED HEMOGLOBIN (HGB A1C): HbA1c, POC (controlled diabetic range): 7.7 % — AB (ref 0.0–7.0)

## 2022-11-01 NOTE — Patient Instructions (Addendum)
It was great seeing you today!  I am sorry you have been having some dizziness, we will check some blood work today and I will call if anything is abnormal. We did check your A1c which was 7.7 and in a stable range so continue the metformin. Otherwise I recommend staying well-hydrated, and doing exercises at home to help increase mobility.  If you decide on physical therapy please call and let me know and I can order that. When changing positions be sure to do so very slowly.  If neuropathy worsens we can increase the gabapentin dose at night but want to be cautious with that so that it does not increase her dizziness.  Please check-out at the front desk before leaving the clinic. I'd like to see you back in 3 months for next follow upbut if you need to be seen earlier than that for any new issues we're happy to fit you in, just give [place] a call!  Feel free to call with any questions or concerns at any time, at [phone].   Take care,  Dr. Cora Collum [facility]  ?? ??????? ????? ?????? ??????!  ???? ??? ????????? ??????? ???? ????? ??????? ?, ???? ?? ???? ????? ??? ???? ???????? ? ???? ???????? ???? ? ?? ????????  ??????, ? ????????? ?????????? ??? ? ???????? ????? ????? ???? ???? ??????? ???? ??????? ??????  ??? ????? ??????? ???????? ?????? ?????????? ??? ????? ?? ????????? ? ???? ???? ???????? ? ? ???? ????? ???? ?????? ?????? ???????? ????? ???? ???????? ???? ??????? ?????????  ??? ???????????? ???????? ??? ???? ????? ??????????????? ????? ????? ?????? ?? ??????? ?????? ??? ???????? ???? ???? ???? ????? ???????  ????? ??????? ?????? ??? ??????? ??????? ???-??? ?????????? ? ???????? ????? ??????? ???? 3 ??????? ?????? ????? ??????? ?? ??? ???????? ???? ???? ??????????? ???? ???? ????? ????? ????? ?????? ? ??? ???? ???????? ??? ???? ?????? ???? ???, ??????? ?? ?????????!  [phone] ?? ???? ??? ??? ???? ??? ?????? ?? ???????? ??? ?? ???? ????????? ?????  ??????????   ????? ??,  ??. ?????????? ??. ???? ??? ????????? ?????? ???????? ??????? ?ja tim?l?'? d?kh?ra r?mr? l?gy?!  Mal?'? m?pha garnuh?s tap?'?l?'? k?hi cakkara ?'irah?k? cha, h?m? ?ja k?hi ragatak? k?ma j?m?ca garn?chau? ra k?hi as?m?n'ya bha'?m? ma kala garn?chu.  An'yath?, ma r?mr?sam?ga h?'i?r????a rahana ra gati??lat? ba?h?'una maddata garna gharam? vy?y?ma garna siph?risa garchu.  Yadi tap?'im? ??r?rika th?r?p?m? nir?aya garnuhuncha bhan? kr?pay? kala garnuh?s ra mal?'? th?h? dinuh?s ra ma ty? ar?ara garna sakchu. Sthiti parivartana gard? dh?rai bist?rai garna ni?cita hunuh?s.  Yadi n'yur?py?th? bigrancha bhan? h?m? r?tam? gy?b?p?n?inak? khur?ka ba?h?'una sakchau? tara tyasasam?ga s?vadh?na rahana c?hanchau? t?ki yasal? unak? cakkara naba?h?s.  Kr?pay? klinika ch??nu aghi ag??ik? ??skam? c?ka-?'u?a garnuh?s. Ma tap?'?nl?'? ark? phal?'apak? l?gi 3 mahin?m? phirt? bh??na c?hanchu tara yadi tap?'?nl?'? kunai nay?m? mudd?har?k? l?gi ty? bhand? pahil? d?khna ?va?yaka cha bhan? h?m? tap?'?nl?'? phi?a garna p?'um?d? khus? chau?, h?m?l?'? kala garnuh?s!  [phone] M? Terrilee Croak pani pra?na v? cint?k? s?tha kala garna svatantra mahasusa garnuh?s.   Khy?la gara,  ??. Bhik??riy? j?. P?'?ja k?na sv?sthya pariv?ra cikits? k?ndra

## 2022-11-01 NOTE — Progress Notes (Unsigned)
    SUBJECTIVE:   CHIEF COMPLAINT / HPI:   Patient presents with her daugther for worsening dizziness for the past week but has been experiencing this for years. Sometimes loses her balance. States its all the time and not related to position. Ambulates with a cane. Does get dizzy when she moves her head side to side. Sometimes gets headaches. Endorses blurry vision for years. No new weakness. No sick symptoms. Has not seen an eye doctor in a while. Declines PT   PERTINENT  PMH / PSH: Reviewed   OBJECTIVE:   BP 126/62   Pulse 86   Ht 5\' 3"  (1.6 m)   Wt 148 lb 3.2 oz (67.2 kg)   SpO2 93%   BMI 26.25 kg/m    Physical exam General: well appearing, NAD Cardiovascular: RRR, no murmurs Lungs: CTAB. Normal WOB Abdomen: soft, non-distended, non-tender Skin: warm, dry. No edema Neuro: CN2-12 in tact    ASSESSMENT/PLAN:   Dizziness Patient presents with worsening dizziness. A1c 7.7 so diabetes should not be contributing. Orthostatic vitals negative. Only medication she is taking that could potentially be contributing is Gabapentin but its the lowest dose of 100mg  and she feels she needs this for her neuropathy so I think its reasonable to continue. Suspect this is multifactorial in the setting of orthostasis, poor vision, age. Has done PT in the past and recommended doing it again but she declines and family states she will do exercises at home. Recommended optho evluation. Will check BMP, CBC, TSH. Return precautions discussed for red flag symptoms such as new weakness, inability to tolerate oral intake, etc    Cora Collum, DO [facility]

## 2022-11-02 LAB — BASIC METABOLIC PANEL
BUN/Creatinine Ratio: 21 (ref 12–28)
BUN: 15 mg/dL (ref 8–27)
CO2: 25 mmol/L (ref 20–29)
Calcium: 9.5 mg/dL (ref 8.7–10.3)
Chloride: 101 mmol/L (ref 96–106)
Creatinine, Ser: 0.7 mg/dL (ref 0.57–1.00)
Glucose: 230 mg/dL — ABNORMAL HIGH (ref 70–99)
Potassium: 4.7 mmol/L (ref 3.5–5.2)
Sodium: 139 mmol/L (ref 134–144)
eGFR: 87 mL/min/{1.73_m2} (ref >59)

## 2022-11-02 LAB — CBC
Hematocrit: 38.4 % (ref 34.0–46.6)
Hemoglobin: 12.1 g/dL (ref 11.1–15.9)
MCH: 27.6 pg (ref 26.6–33.0)
MCHC: 31.5 g/dL (ref 31.5–35.7)
MCV: 88 fL (ref 79–97)
Platelets: 307 10*3/uL (ref 150–450)
RBC: 4.39 x10E6/uL (ref 3.77–5.28)
RDW: 13.2 % (ref 11.7–15.4)
WBC: 10.5 10*3/uL (ref 3.4–10.8)

## 2022-11-02 LAB — T4F: T4,Free (Direct): 1.03 ng/dL (ref 0.82–1.77)

## 2022-11-02 LAB — TSH RFX ON ABNORMAL TO FREE T4: TSH: 4.72 u[IU]/mL — ABNORMAL HIGH (ref 0.450–4.500)

## 2022-11-02 NOTE — Assessment & Plan Note (Signed)
Patient presents with worsening dizziness. A1c 7.7 so diabetes should not be contributing. Orthostatic vitals negative. Only medication she is taking that could potentially be contributing is Gabapentin but its the lowest dose of 100mg  and she feels she needs this for her neuropathy so I think its reasonable to continue. Suspect this is multifactorial in the setting of orthostasis, poor vision, age. Has done PT in the past and recommended doing it again but she declines and family states she will do exercises at home. Recommended optho evluation. Will check BMP, CBC, TSH. Return precautions discussed for red flag symptoms such as new weakness, inability to tolerate oral intake, etc

## 2022-12-16 ENCOUNTER — Other Ambulatory Visit: Payer: Self-pay | Admitting: *Deleted

## 2022-12-16 DIAGNOSIS — E785 Hyperlipidemia, unspecified: Secondary | ICD-10-CM

## 2022-12-16 MED ORDER — ATORVASTATIN CALCIUM 40 MG PO TABS: 40.0000 mg | ORAL_TABLET | Freq: Every day | ORAL | 3 refills | Status: DC

## 2023-02-16 ENCOUNTER — Other Ambulatory Visit: Payer: Self-pay

## 2023-02-16 ENCOUNTER — Ambulatory Visit: Payer: 59 | Admitting: Family Medicine

## 2023-02-16 VITALS — BP 149/78 | HR 86 | Ht 63.0 in | Wt 146.2 lb

## 2023-02-16 DIAGNOSIS — E1142 Type 2 diabetes mellitus with diabetic polyneuropathy: Secondary | ICD-10-CM

## 2023-02-16 DIAGNOSIS — E1159 Type 2 diabetes mellitus with other circulatory complications: Secondary | ICD-10-CM | POA: Diagnosis not present

## 2023-02-16 DIAGNOSIS — R42 Dizziness and giddiness: Secondary | ICD-10-CM

## 2023-02-16 DIAGNOSIS — Z7984 Long term (current) use of oral hypoglycemic drugs: Secondary | ICD-10-CM

## 2023-02-16 DIAGNOSIS — I152 Hypertension secondary to endocrine disorders: Secondary | ICD-10-CM | POA: Diagnosis not present

## 2023-02-16 LAB — POCT GLYCOSYLATED HEMOGLOBIN (HGB A1C): HbA1c, POC (controlled diabetic range): 7.7 % — AB (ref 0.0–7.0)

## 2023-02-16 MED ORDER — MECLIZINE HCL 12.5 MG PO TABS: 6.2500 mg | ORAL_TABLET | Freq: Every day | ORAL | 0 refills | Status: DC | PRN

## 2023-02-16 MED ORDER — EMPAGLIFLOZIN 10 MG PO TABS: 10.0000 mg | ORAL_TABLET | Freq: Every day | ORAL | 3 refills | Status: DC

## 2023-02-16 NOTE — Patient Instructions (Signed)
It was wonderful to see you today.  Please bring ALL of your medications with you to every visit.   Today we talked about:  Dizziness - I am running some labs. I will let you know the result. Please try the meclizine (half tablet) max one per day. Please stay near the patient as she might get more dizzy or sleepy. If it does not help you can stop using it. I have also sent a referral to an ENT specialist   Blood pressure and diabetes - I have started another medication called Jardiance.   Please follow up in 1 month  Thank you for choosing [facility].   Please call [phone] with any questions about today's appointment.  Please be sure to schedule follow up at the front desk before you leave today.   Lockie Mola, MD  Family Medicine

## 2023-02-16 NOTE — Progress Notes (Signed)
    SUBJECTIVE:   CHIEF COMPLAINT / HPI:   Dizziness  Patient reports ringing in her ears as well as dizziness. Feels like the room is spinning when she is seated and when standing. Denies lightheadedness when going from sitting to standing. Ringing in her ears all the time. This has been an ongoing issue for this patient for years, but patient has not had resolution of symptoms. Has tried physical therapy without success. Patient is eating though has low appetite. She eats 1-2 full meals a day. No falls, car accident, or head trauma  Has been getting worse since Tuesday  No new medicines  No blood per rectum. No vaginal bleeding.   Does not smoke anymore     PERTINENT  PMH / PSH: COPD, T2DM,   OBJECTIVE:   BP (!) 149/78   Pulse 86   Ht 5\' 3"  (1.6 m)   Wt 146 lb 3.2 oz (66.3 kg)   SpO2 94%   BMI 25.90 kg/m   General: well appearing, in no acute distress CV: RRR, radial pulses equal and palpable, no BLE edema  Resp: Normal work of breathing on room air, CTAB Abd: Soft, non tender, non distended  Neuro: Alert & Oriented x 4, PERRLA, EOMI, CN2-12 intact, strength in upper and lower extremities intact, wavers when standing with eyes closed.  Foot exam: patient with normal sensation and proprioception in both feet    ASSESSMENT/PLAN:   Assessment & Plan Type 2 diabetes mellitus with diabetic polyneuropathy, without long-term current use of insulin (HCC) Patient has been consistent with medications. Continues to have neuropathic pain. A1c today continues to be elevated at 7.7.  - Start jardiance 10 mg daily  - Follow up in one month  Dizziness Seems to be vertigo like in nature. Patient is not on excessive medications that could cause dizziness. No alarm signs and neuro exam is benign. Could be vitamin deficiency as patient does not eat much or could be neuropathy related however proprioception was normal on foot exam.  - RPR, B12, BMP, CBC - Referral to ENT  - Trial  meclizine, half tablet (6.25mg ) daily prn to be careful in patient of this age. Counseled patient's daughter to trial medication and if it worsens patient's symptoms to stop medication.  - Follow up in one month  Hypertension associated with diabetes (HCC) Uncontrolled during visit today. Patient did not take her medication today, - Counseled patient regarding medication adherence.  - Given dizziness will not add another medication or make adjustments at this visit.  - Follow up with PCP and recheck BP.   Declined flu and COVID vaccines    Lockie Mola, MD [facility]

## 2023-02-17 LAB — CBC WITH DIFFERENTIAL/PLATELET
Basophils Absolute: 0.1 10*3/uL (ref 0.0–0.2)
Basos: 1 %
EOS (ABSOLUTE): 0.1 10*3/uL (ref 0.0–0.4)
Eos: 1 %
Hematocrit: 39.5 % (ref 34.0–46.6)
Hemoglobin: 11.9 g/dL (ref 11.1–15.9)
Immature Grans (Abs): 0.1 10*3/uL (ref 0.0–0.1)
Immature Granulocytes: 1 %
Lymphocytes Absolute: 2 10*3/uL (ref 0.7–3.1)
Lymphs: 19 %
MCH: 26.2 pg — ABNORMAL LOW (ref 26.6–33.0)
MCHC: 30.1 g/dL — ABNORMAL LOW (ref 31.5–35.7)
MCV: 87 fL (ref 79–97)
Monocytes Absolute: 0.8 10*3/uL (ref 0.1–0.9)
Monocytes: 8 %
Neutrophils Absolute: 7.4 10*3/uL — ABNORMAL HIGH (ref 1.4–7.0)
Neutrophils: 70 %
Platelets: 303 10*3/uL (ref 150–450)
RBC: 4.54 x10E6/uL (ref 3.77–5.28)
RDW: 13.6 % (ref 11.7–15.4)
WBC: 10.4 10*3/uL (ref 3.4–10.8)

## 2023-02-17 LAB — BASIC METABOLIC PANEL
BUN/Creatinine Ratio: 25 (ref 12–28)
BUN: 18 mg/dL (ref 8–27)
CO2: 25 mmol/L (ref 20–29)
Calcium: 9.7 mg/dL (ref 8.7–10.3)
Chloride: 100 mmol/L (ref 96–106)
Creatinine, Ser: 0.73 mg/dL (ref 0.57–1.00)
Glucose: 148 mg/dL — ABNORMAL HIGH (ref 70–99)
Potassium: 4.4 mmol/L (ref 3.5–5.2)
Sodium: 141 mmol/L (ref 134–144)
eGFR: 83 mL/min/{1.73_m2} (ref >59)

## 2023-02-17 LAB — VITAMIN B12: Vitamin B-12: 359 pg/mL (ref 232–1245)

## 2023-02-17 LAB — RPR: RPR Ser Ql: NONREACTIVE

## 2023-02-20 ENCOUNTER — Telehealth: Payer: Self-pay | Admitting: Family Medicine

## 2023-02-20 NOTE — Telephone Encounter (Signed)
-----   Message from Westley Chandler sent at 02/17/2023  8:24 AM EDT ----- Regarding: FW: ----- Message ----- From: Jennette Bill, CMA Sent: 02/16/2023   3:45 PM EDT To: Westley Chandler, MD

## 2023-02-20 NOTE — Telephone Encounter (Signed)
Called patient with interpreter.  Discussed labs with patient's daughter. Patients daughter says that they have not gotten a call yet from ENT office and says mom is stable.

## 2023-02-24 NOTE — Assessment & Plan Note (Signed)
Seems to be vertigo like in nature. Patient is not on excessive medications that could cause dizziness. No alarm signs and neuro exam is benign. Could be vitamin deficiency as patient does not eat much or could be neuropathy related however proprioception was normal on foot exam.  - RPR, B12, BMP, CBC - Referral to ENT  - Trial meclizine, half tablet (6.25mg ) daily prn to be careful in patient of this age. Counseled patient's daughter to trial medication and if it worsens patient's symptoms to stop medication.  - Follow up in one month

## 2023-02-24 NOTE — Assessment & Plan Note (Signed)
Patient has been consistent with medications. Continues to have neuropathic pain. A1c today continues to be elevated at 7.7.  - Start jardiance 10 mg daily  - Follow up in one month

## 2023-06-19 ENCOUNTER — Other Ambulatory Visit: Payer: Self-pay | Admitting: Family Medicine

## 2023-06-20 ENCOUNTER — Other Ambulatory Visit: Payer: Self-pay

## 2023-06-20 MED ORDER — LOSARTAN POTASSIUM 25 MG PO TABS: 25.0000 mg | ORAL_TABLET | Freq: Every day | ORAL | 3 refills | Status: AC

## 2023-07-07 ENCOUNTER — Ambulatory Visit: Payer: 59 | Admitting: Student

## 2023-07-07 ENCOUNTER — Telehealth: Payer: Self-pay

## 2023-07-07 VITALS — BP 132/68 | HR 94 | Wt 144.4 lb

## 2023-07-07 DIAGNOSIS — B349 Viral infection, unspecified: Secondary | ICD-10-CM

## 2023-07-07 DIAGNOSIS — J439 Emphysema, unspecified: Secondary | ICD-10-CM

## 2023-07-07 LAB — POC SOFIA 2 FLU + SARS ANTIGEN FIA
Influenza A, POC: NEGATIVE
Influenza B, POC: NEGATIVE
SARS Coronavirus 2 Ag: NEGATIVE

## 2023-07-07 MED ORDER — ALBUTEROL SULFATE HFA 108 (90 BASE) MCG/ACT IN AERS: 2.0000 | INHALATION_SPRAY | Freq: Four times a day (QID) | RESPIRATORY_TRACT | 2 refills | Status: DC | PRN

## 2023-07-07 NOTE — Telephone Encounter (Signed)
Daughter in law calls nurse line requesting appointment for cough and chest pain.   Reports chest pain as tightness that occurs with coughing episodes. This has been present for the last two days.   Unknown fever. She is also complaining of body aches. Daughter says that she will hear wheezing at times throughout the day.   Scheduled same day appointment this morning.   Strict ED precautions discussed between now and appointment.   Veronda Prude, RN

## 2023-07-07 NOTE — Progress Notes (Signed)
    SUBJECTIVE:   CHIEF COMPLAINT / HPI:   Acute Viral Syndrome Cough, Sore throat x3 days.  Has not had any respiratory distress.  No one else at home is sick.  She denies any productive cough.  It is just been persistent and dry.  No fevers that she knows of.  Has been in her usual state of health otherwise.  She is here with a relative who interprets for her. They decline Nepali video interpreter.   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 Speaking in full sentences.   ASSESSMENT/PLAN:   Assessment & Plan Viral syndrome COVID and flu testing is negative today.  She is in no respiratory distress, though she does have a faint expiratory wheeze. -Supportive care reviewed, return precautions for respiratory distress or change in status otherwise -I have refilled her albuterol inhaler, sounds like it has been quite sometime since she has had this -It is unclear if she has been using her Anoro or not, will refill at this time though     J Dorothyann Gibbs, MD [facility]

## 2023-07-07 NOTE — Patient Instructions (Addendum)
I want you to use your albuterol inhaler every four hours while you are awake for the next two days or so.  Your tests for COVID and flu were both negative.  If you start having a harder time breathing or start producing sputum with your cough, please call us right away and I will send in treatment.    Eliezer Mccoy, MD

## 2023-07-09 MED ORDER — ANORO ELLIPTA 62.5-25 MCG/ACT IN AEPB: 1.0000 | INHALATION_SPRAY | Freq: Every day | RESPIRATORY_TRACT | 2 refills | Status: DC

## 2023-07-24 ENCOUNTER — Ambulatory Visit: Admitting: Family Medicine

## 2023-08-14 NOTE — Telephone Encounter (Signed)
 Marland Kitchen

## 2023-12-04 ENCOUNTER — Ambulatory Visit (INDEPENDENT_AMBULATORY_CARE_PROVIDER_SITE_OTHER)

## 2023-12-04 DIAGNOSIS — R300 Dysuria: Secondary | ICD-10-CM

## 2023-12-04 DIAGNOSIS — N3001 Acute cystitis with hematuria: Secondary | ICD-10-CM

## 2023-12-04 LAB — POCT URINALYSIS DIP (MANUAL ENTRY)
Bilirubin, UA: NEGATIVE
Glucose, UA: 100 mg/dL — AB
Ketones, POC UA: NEGATIVE mg/dL
Nitrite, UA: NEGATIVE
Spec Grav, UA: 1.015 (ref 1.010–1.025)
Urobilinogen, UA: 0.2 U/dL
pH, UA: 7 (ref 5.0–8.0)

## 2023-12-04 LAB — POCT UA - MICROSCOPIC ONLY

## 2023-12-04 MED ORDER — CEPHALEXIN 500 MG PO CAPS: 500.0000 mg | ORAL_CAPSULE | Freq: Two times a day (BID) | ORAL | 0 refills | Status: DC

## 2023-12-04 MED ORDER — PHENAZOPYRIDINE HCL 100 MG PO TABS: 100.0000 mg | ORAL_TABLET | Freq: Three times a day (TID) | ORAL | 0 refills | Status: AC | PRN

## 2023-12-04 NOTE — Progress Notes (Signed)
    SUBJECTIVE:   CHIEF COMPLAINT / HPI:   Patient declines use of interpreter, daughter-in-law is interpreting for her at bedside.  Martha Gillespie is an 83 year old female who presents to the clinic with primary complaint of dysuria x 3 days.  Patient does report hematuria and suprapubic pain, denies fever or flank pain.  Daughter-in-law states that she gets UTIs every 6 to 7 months, she is not sexually active and has no new partners.  She has tried Tylenol  for the pain but it did not help.  PERTINENT  PMH / PSH: recurrent UTIs  OBJECTIVE:   BP 134/75   Pulse 96   Ht 5' 3 (1.6 m)   Wt 145 lb 9.6 oz (66 kg)   SpO2 91%   BMI 25.79 kg/m   General: A&O, NAD HEENT: No sign of trauma, EOM grossly intact Respiratory: normal WOB GI: non-distended, no masses, no rebound or guarding, mild TTP of the suprapubic region Extremities: no peripheral edema. Neuro: Normal gait, moves all four extremities appropriately Skin: no lesions/rashes visualized Psych: Appropriate mood and affect   ASSESSMENT/PLAN:   Assessment & Plan Acute cystitis with hematuria POC urinalysis done in office today positive for cloudy urine, small blood, trace protein, and 2+ leukocytes. -Cephalexin  500 mg twice daily for 5 days sent into pharmacy. -Pyridium  100 mg 3 times daily for 2 days as needed for bladder pain/dysuria sent into the pharmacy.  Discussed side effects such as orange urine with use of this drug.  Patient and daughter-in-law verbalized understanding.    Camie Dixons, DO [facility]

## 2023-12-04 NOTE — Patient Instructions (Signed)
 It was wonderful to see you today.  Please bring ALL of your medications with you to every visit.   Today we talked about:  Your urinary infection. Your antibiotic keflex  500mg  twice a day and pain medication pyridium  3 times a day as needed for pain have been sent to the pharmacy.   Thank you for choosing [facility].   Please call [phone] with any questions about today's appointment.  Please arrive at least 15 minutes prior to your scheduled appointments.   If you had blood work today, I will send you a MyChart message or a letter if results are normal. Otherwise, I will give you a call.   If you had a referral placed, they will call you to set up an appointment. Please give us  a call if you don't hear back in the next 2 weeks.   If you need additional refills before your next appointment, please call your pharmacy first.   You should follow up in our clinic in as needed.   Camie Dixons, DO Family Medicine

## 2023-12-04 NOTE — Assessment & Plan Note (Deleted)
 POC urinalysis done in office today positive for

## 2023-12-08 ENCOUNTER — Ambulatory Visit

## 2023-12-08 ENCOUNTER — Other Ambulatory Visit: Payer: Self-pay

## 2023-12-08 DIAGNOSIS — E1142 Type 2 diabetes mellitus with diabetic polyneuropathy: Secondary | ICD-10-CM

## 2023-12-08 DIAGNOSIS — R300 Dysuria: Secondary | ICD-10-CM

## 2023-12-08 LAB — URINE CULTURE

## 2023-12-14 ENCOUNTER — Other Ambulatory Visit: Payer: Self-pay | Admitting: Family Medicine

## 2023-12-14 DIAGNOSIS — E785 Hyperlipidemia, unspecified: Secondary | ICD-10-CM

## 2023-12-19 ENCOUNTER — Other Ambulatory Visit: Payer: Self-pay | Admitting: Family Medicine

## 2023-12-19 DIAGNOSIS — R42 Dizziness and giddiness: Secondary | ICD-10-CM

## 2024-01-13 ENCOUNTER — Encounter (HOSPITAL_COMMUNITY): Payer: Self-pay

## 2024-01-13 ENCOUNTER — Emergency Department (HOSPITAL_COMMUNITY)

## 2024-01-13 ENCOUNTER — Emergency Department (HOSPITAL_COMMUNITY)
Admission: EM | Admit: 2024-01-13 | Discharge: 2024-01-13 | Disposition: A | Discharge status: Home / Self Care | Attending: Emergency Medicine | Admitting: Emergency Medicine

## 2024-01-13 ENCOUNTER — Other Ambulatory Visit: Payer: Self-pay

## 2024-01-13 DIAGNOSIS — I10 Essential (primary) hypertension: Secondary | ICD-10-CM | POA: Insufficient documentation

## 2024-01-13 DIAGNOSIS — Z87891 Personal history of nicotine dependence: Secondary | ICD-10-CM | POA: Diagnosis not present

## 2024-01-13 DIAGNOSIS — R1032 Left lower quadrant pain: Secondary | ICD-10-CM | POA: Diagnosis present

## 2024-01-13 DIAGNOSIS — Z79899 Other long term (current) drug therapy: Secondary | ICD-10-CM | POA: Insufficient documentation

## 2024-01-13 DIAGNOSIS — J449 Chronic obstructive pulmonary disease, unspecified: Secondary | ICD-10-CM | POA: Insufficient documentation

## 2024-01-13 DIAGNOSIS — Z7951 Long term (current) use of inhaled steroids: Secondary | ICD-10-CM | POA: Insufficient documentation

## 2024-01-13 DIAGNOSIS — K5900 Constipation, unspecified: Secondary | ICD-10-CM | POA: Insufficient documentation

## 2024-01-13 DIAGNOSIS — D649 Anemia, unspecified: Secondary | ICD-10-CM | POA: Diagnosis not present

## 2024-01-13 DIAGNOSIS — Z7984 Long term (current) use of oral hypoglycemic drugs: Secondary | ICD-10-CM | POA: Insufficient documentation

## 2024-01-13 DIAGNOSIS — E119 Type 2 diabetes mellitus without complications: Secondary | ICD-10-CM | POA: Insufficient documentation

## 2024-01-13 LAB — COMPREHENSIVE METABOLIC PANEL WITH GFR
ALT: 25 U/L (ref 0–44)
AST: 26 U/L (ref 15–41)
Albumin: 3.9 g/dL (ref 3.5–5.0)
Alkaline Phosphatase: 66 U/L (ref 38–126)
Anion gap: 13 (ref 5–15)
BUN: 14 mg/dL (ref 8–23)
CO2: 25 mmol/L (ref 22–32)
Calcium: 9.7 mg/dL (ref 8.9–10.3)
Chloride: 102 mmol/L (ref 98–111)
Creatinine, Ser: 0.7 mg/dL (ref 0.44–1.00)
GFR, Estimated: >60 mL/min (ref >60)
Glucose, Bld: 101 mg/dL — ABNORMAL HIGH (ref 70–99)
Potassium: 4.6 mmol/L (ref 3.5–5.1)
Sodium: 140 mmol/L (ref 135–145)
Total Bilirubin: 0.5 mg/dL (ref 0.0–1.2)
Total Protein: 7.8 g/dL (ref 6.5–8.1)

## 2024-01-13 LAB — URINALYSIS, ROUTINE W REFLEX MICROSCOPIC
Bilirubin Urine: NEGATIVE
Glucose, UA: NEGATIVE mg/dL
Hgb urine dipstick: NEGATIVE
Ketones, ur: NEGATIVE mg/dL
Nitrite: NEGATIVE
Protein, ur: NEGATIVE mg/dL
Specific Gravity, Urine: 1.012 (ref 1.005–1.030)
pH: 6 (ref 5.0–8.0)

## 2024-01-13 LAB — CBC
HCT: 37.9 % (ref 36.0–46.0)
Hemoglobin: 11.6 g/dL — ABNORMAL LOW (ref 12.0–15.0)
MCH: 25.2 pg — ABNORMAL LOW (ref 26.0–34.0)
MCHC: 30.6 g/dL (ref 30.0–36.0)
MCV: 82.2 fL (ref 80.0–100.0)
Platelets: 375 K/uL (ref 150–400)
RBC: 4.61 MIL/uL (ref 3.87–5.11)
RDW: 16.6 % — ABNORMAL HIGH (ref 11.5–15.5)
WBC: 10 K/uL (ref 4.0–10.5)
nRBC: 0 % (ref 0.0–0.2)

## 2024-01-13 LAB — LIPASE, BLOOD: Lipase: 33 U/L (ref 11–51)

## 2024-01-13 MED ORDER — ONDANSETRON HCL 4 MG/2ML IJ SOLN
4.0000 mg | Freq: Once | INTRAMUSCULAR | Status: AC
  Administered 2024-01-13: 4 mg via INTRAVENOUS
  Filled 2024-01-13: qty 2

## 2024-01-13 MED ORDER — SODIUM CHLORIDE 0.9 % IV BOLUS
1000.0000 mL | Freq: Once | INTRAVENOUS | Status: AC
  Administered 2024-01-13: 1000 mL via INTRAVENOUS

## 2024-01-13 MED ORDER — POLYETHYLENE GLYCOL 3350 17 GM/SCOOP PO POWD: 17.0000 g | Freq: Every day | ORAL | 0 refills | Status: AC

## 2024-01-13 MED ORDER — SENNOSIDES-DOCUSATE SODIUM 8.6-50 MG PO TABS: 1.0000 | ORAL_TABLET | Freq: Every day | ORAL | 0 refills | Status: AC

## 2024-01-13 MED ORDER — MORPHINE SULFATE (PF) 4 MG/ML IV SOLN
4.0000 mg | Freq: Once | INTRAVENOUS | Status: AC
  Administered 2024-01-13: 4 mg via INTRAVENOUS
  Filled 2024-01-13: qty 1

## 2024-01-13 MED ORDER — IOHEXOL 350 MG/ML SOLN
75.0000 mL | Freq: Once | INTRAVENOUS | Status: AC | PRN
  Administered 2024-01-13: 75 mL via INTRAVENOUS

## 2024-01-13 NOTE — Discharge Instructions (Addendum)
?????? ??????? ??? ??????? ???? ?????????? ??????? ??????? ??????? ??????? ?????? ????? ????? ???????   CT ??????? ? ??? ????????? ??????? ?????????? ???? ?????? ???? ??????? ???? ??????? ????? ?? ?? ??? ???????? ??  ??????? ????????? ????????? ????? ??????? ?? ????? ? ?? ?? ??????? ??????? ???? ??? ???? ???? ?????? ???? ???? ??????? ??????? ?????? MiraLAX  ???? ???? ? senna ???? ???? ??????? ????? ???? ?? ??????? ????? ?? ??? ??? ??????? ???? ????????? ????? ???????  ????? ???-?? ?????? ????? ????? ????????? ???? ????????? ??? ?? ?????????? ??? ???????? ????? ?????, ?????, ???????? ?????, ??????, ?? ???? ???? ??? ????????? ???????? ????? ???? ???? ?? ???????? ???? ???????? ??? ??? ????? ???????????  We evaluated you for your abdominal pain.  Your testing was reassuring.  Your CT scan and blood tests did not show any dangerous cause of your symptoms.  We feel that it is safe to go home.  Your testing did show signs of constipation.  It is possible that this is the cause of your pain.  We do recommend starting a stool softener.  We have prescribed a medication called MiraLAX  and a medication called senna.  These medications can be both taken once daily.  These should help with constipation.  Please also call your family medicine doctor to schedule follow-up.  Please return if you have any new or worsening symptoms like worsening pain, fevers, painful urination, vomiting, or any other concerning symptoms.

## 2024-01-13 NOTE — ED Triage Notes (Addendum)
 Nepali interpreter used for triage. Pt c/o abdominal pain and nausea for several weeks which worsened last night. Denies diarrhea or constipation. Pt c/o left upper and lower abdominal pain. Last BM possibly yesterday, pt is unsure.

## 2024-01-13 NOTE — ED Provider Notes (Signed)
 [facility] AT [facility] Provider Note  CSN: [id number] Arrival date & time: 01/13/24 1034  Chief Complaint(s) Abdominal Pain  HPI Martha Gillespie is a 83 y.o. female history of diabetes presenting to the emergency with abdominal pain.  Patient has having pain for the past few days, worse last night.  Reports associated nausea, no vomiting.  Pain is in the left lower abdomen.  No diarrhea, constipation.  Occasionally has complained of painful urination to daughter but not recently.  No chest pain or shortness of breath.  No leg swelling.  History provided by patient and daughter.  Offered formal Nepali interpreter however patient and daughter refused  Past Medical History Past Medical History:  Diagnosis Date   Abdominal pain 02/13/2012   Cough due to ACE inhibitor 05/05/2021   Diabetes mellitus (HCC) 02/15/2012   Dizziness 08/15/2019   Dysuria 05/05/2021   Eye pain, bilateral 06/20/2016   Hematuria 05/12/2021   Hypertension    URI with cough and congestion 06/03/2021   Weight loss 06/03/2020   Patient Active Problem List   Diagnosis Date Noted   Wheezing 04/13/2022   Dysuria 05/05/2021   Chronic nonintractable headache 06/03/2020   Dizziness 08/15/2019   Hearing loss 10/27/2018   Osteoporosis 07/31/2017   Memory problem 09/30/2016   Osteoarthritis of left knee 07/26/2016   Hyperlipidemia 06/20/2016   Neuropathic pain 06/20/2016   White coat syndrome with high blood pressure without hypertension 06/26/2014   Diabetes mellitus (HCC) 02/15/2012   COPD (chronic obstructive pulmonary disease) (HCC) 02/15/2012   Home Medication(s) Prior to Admission medications   Medication Sig Start Date End Date Taking? Authorizing Provider  polyethylene glycol powder (MIRALAX ) 17 GM/SCOOP powder Take 17 g by mouth daily. 01/13/24  Yes Francesca Elsie CROME, MD  senna-docusate (SENOKOT-S) 8.6-50 MG tablet Take 1 tablet by mouth daily. 01/13/24  Yes Francesca Elsie CROME,  MD  albuterol  (VENTOLIN  HFA) 108 (90 Base) MCG/ACT inhaler Inhale 2 puffs into the lungs every 6 (six) hours as needed for wheezing or shortness of breath. 07/07/23   Marlee Lynwood NOVAK, MD  atorvastatin  (LIPITOR ) 40 MG tablet TAKE 1 TABLET(40 MG) BY MOUTH DAILY 12/14/23   Baloch, Mahnoor, MD  Calcium  Carbonate-Vitamin D  600-400 MG-UNIT tablet TAKE TWO TABLETS BY MOUTH EVERY DAY WITH FOOD 06/07/19   Francesco Alan BROCKS, MD  cephALEXin  (KEFLEX ) 500 MG capsule Take 1 capsule (500 mg total) by mouth 2 (two) times daily. 12/04/23   Majeed, Camie, DO  empagliflozin  (JARDIANCE ) 10 MG TABS tablet Take 1 tablet (10 mg total) by mouth daily. 02/16/23   Nicholas Bar, MD  fluticasone  (FLONASE ) 50 MCG/ACT nasal spray Place 2 sprays into both nostrils daily. 03/24/21   Tonette Lauraine HERO, PA-C  gabapentin  (NEURONTIN ) 100 MG capsule TAKE 1 CAPSULE BY MOUTH AT BEDTIME 07/05/21   Hope Merle, MD  ipratropium-albuterol  (DUONEB) 0.5-2.5 (3) MG/3ML SOLN USE 1 VIAL VIA NEBULIZER EVERY 4 HOURS AS NEEDED 07/04/22   Malvina Ellen, MD  losartan  (COZAAR ) 25 MG tablet Take 1 tablet (25 mg total) by mouth daily. 06/20/23   Baloch, Mahnoor, MD  meclizine  (ANTIVERT ) 12.5 MG tablet TAKE 1/2 TABLET(6.25 MG) BY MOUTH DAILY AS NEEDED FOR DIZZINESS 12/20/23   Lafe Lauraine, DO  metFORMIN  (GLUCOPHAGE -XR) 500 MG 24 hr tablet TAKE 2 TABLETS(1000 MG) BY MOUTH IN THE MORNING AND AT BEDTIME 06/19/23   Baloch, Mahnoor, MD  phenazopyridine  (PYRIDIUM ) 100 MG tablet Take 1 tablet (100 mg total) by mouth 3 (three) times daily as  needed for pain. 12/04/23   Lupie Credit, DO  Skin Protectants, Misc. (EUCERIN) cream Apply topically as needed for dry skin. 10/18/17   Gonfa, Taye T, MD  umeclidinium-vilanterol (ANORO ELLIPTA ) 62.5-25 MCG/ACT AEPB Inhale 1 puff into the lungs daily. 07/09/23   Marlee Lynwood NOVAK, MD  lisinopril  (ZESTRIL ) 2.5 MG tablet Take 1 tablet (2.5 mg total) by mouth at bedtime. 02/04/21   Dameron, Marisa, DO                                                                                                                                     Past Surgical History Past Surgical History:  Procedure Laterality Date   ESOPHAGOGASTRODUODENOSCOPY  02/14/2012   Procedure: ESOPHAGOGASTRODUODENOSCOPY (EGD);  Surgeon: Belvie JONETTA Just, MD;  [facility] ENDOSCOPY;  Service: Endoscopy;  Laterality: N/A;   Family History Family History  Family history unknown: Yes    Social History Social History   Tobacco Use   Smoking status: Former    Current packs/day: 0.00    Types: Cigarettes    Quit date: 07/25/2013    Years since quitting: 10.4    Passive exposure: Current   Smokeless tobacco: Never   Tobacco comments:    Quit in 07/2022  Vaping Use   Vaping status: Never Used  Substance Use Topics   Alcohol use: No   Drug use: No   Allergies Lisinopril   Review of Systems Review of Systems  All other systems reviewed and are negative.   Physical Exam Vital Signs  I have reviewed the triage vital signs BP (!) 167/82   Pulse 97   Temp 98 F (36.7 C) (Oral)   Resp 18   Ht 5' 3 (1.6 m)   Wt 66 kg   SpO2 97%   BMI 25.77 kg/m  Physical Exam Vitals and nursing note reviewed.  Constitutional:      General: She is not in acute distress.    Appearance: She is well-developed.  HENT:     Head: Normocephalic and atraumatic.     Mouth/Throat:     Mouth: Mucous membranes are moist.  Eyes:     Pupils: Pupils are equal, round, and reactive to light.  Cardiovascular:     Rate and Rhythm: Normal rate and regular rhythm.     Heart sounds: No murmur heard. Pulmonary:     Effort: Pulmonary effort is normal. No respiratory distress.     Breath sounds: Normal breath sounds.  Abdominal:     General: Abdomen is flat.     Palpations: Abdomen is soft.     Tenderness: There is abdominal tenderness in the left lower quadrant.  Musculoskeletal:        General: No tenderness.     Right lower leg: No edema.     Left lower leg: No edema.  Skin:     General: Skin is warm and dry.  Neurological:  General: No focal deficit present.     Mental Status: She is alert. Mental status is at baseline.  Psychiatric:        Mood and Affect: Mood normal.        Behavior: Behavior normal.     ED Results and Treatments Labs (all labs ordered are listed, but only abnormal results are displayed) Labs Reviewed  COMPREHENSIVE METABOLIC PANEL WITH GFR - Abnormal; Notable for the following components:      Result Value   Glucose, Bld 101 (*)    All other components within normal limits  CBC - Abnormal; Notable for the following components:   Hemoglobin 11.6 (*)    MCH 25.2 (*)    RDW 16.6 (*)    All other components within normal limits  URINALYSIS, ROUTINE W REFLEX MICROSCOPIC - Abnormal; Notable for the following components:   Color, Urine STRAW (*)    Leukocytes,Ua TRACE (*)    Bacteria, UA RARE (*)    All other components within normal limits  LIPASE, BLOOD                                                                                                                          Radiology CT ABDOMEN PELVIS W CONTRAST Result Date: 01/13/2024 EXAM: CT ABDOMEN AND PELVIS WITH CONTRAST 01/13/2024 01:16:40 PM TECHNIQUE: CT of the abdomen and pelvis was performed with the administration of intravenous contrast. Multiplanar reformatted images are provided for review. Automated exposure control, iterative reconstruction, and/or weight-based adjustment of the mA/kV was utilized to reduce the radiation dose to as low as reasonably achievable. COMPARISON: None available. CLINICAL HISTORY: LLQ abdominal pain; LL!Q, umbilical, LUQ abd pain and TTP sx for 2 weeks but acutely worsened last PM. Chief complaints; Abdominal Pain. FINDINGS: LOWER CHEST: Asymmetric elevation of right hemidiaphragm. Scar versus subsegmental atelectasis in the right middle lobe. LIVER: Normal size and contour. No focal liver abnormality. GALLBLADDER AND BILE DUCTS: No wall  thickening. No cholelithiasis. No biliary ductal dilatation. SPLEEN: Normal size. No focal lesion. PANCREAS: No mass. No ductal dilatation. ADRENAL GLANDS: Normal appearance. No mass. KIDNEYS, URETERS AND BLADDER: No stones in the kidneys or ureters. No hydronephrosis. No perinephric or periureteral stranding. Urinary bladder is unremarkable. GI AND BOWEL: The appendix is visualized and appears normal. There is a moderate amount of retained stool within the colon which may reflect underlying constipation. No pathologic dilatation of the large or small bowel loops. No signs of bowel inflammation. PERITONEUM AND RETROPERITONEUM: No free fluid or fluid collections. No pneumoperitoneum. VASCULATURE: Aortic atherosclerotic calcification. LYMPH NODES: No abdominal or pelvic adenopathy. REPRODUCTIVE ORGANS: Atrophic uterus. Cyst within the left adnexa measures 2.9 cm, compatible with a benign simple cyst. No follow up imaging recommended. BONES AND SOFT TISSUES: No acute or suspicious osseous abnormality. IMPRESSION: 1. No acute findings in the abdomen or pelvis. 2. Moderate amount of retained stool within the colon, possibly reflecting underlying constipation. Electronically signed by: Waddell Calk MD 01/13/2024 01:41  PM EDT RP Workstation: [id number]    Pertinent labs & imaging results that were available during my care of the patient were reviewed by me and considered in my medical decision making (see MDM for details).  Medications Ordered in ED Medications  sodium chloride  0.9 % bolus 1,000 mL (0 mLs Intravenous Stopped 01/13/24 1408)  ondansetron  (ZOFRAN ) injection 4 mg (4 mg Intravenous Given 01/13/24 1231)  morphine  (PF) 4 MG/ML injection 4 mg (4 mg Intravenous Given 01/13/24 1227)  iohexol  (OMNIPAQUE ) 350 MG/ML injection 75 mL (75 mLs Intravenous Contrast Given 01/13/24 1317)                                                                                                                                      Procedures Procedures  (including critical care time)  Medical Decision Making / ED Course   MDM:  83 year old presenting with abdominal pain.  Patient overall well-appearing, physical exam with left lower quadrant tenderness.  Differential includes diverticulitis, perforation, abscess, colitis, volvulus, constipation, UTI, kidney stone.  Will check labs including urinalysis and lipase.  Will obtain CT abdomen pelvis.  Will treat symptoms and reassess.  Clinical Course as of 01/13/24 1611  Sat Jan 13, 2024  1412 BMI (Calculated): 25.78 [WS]  1610 Patient is feeling better.  CT scan shows evidence of constipation without evidence of other acute process.  Suspect this is likely the cause of the patient's symptoms.  Her lab testing is also reassuring.  Urinalysis without sign of UTI.  Will prescribe MiraLAX . Will discharge patient to home. All questions answered. Patient comfortable with plan of discharge. Return precautions discussed with patient and specified on the after visit summary.  [WS]    Clinical Course User Index [WS] Francesca, Elsie CROME, MD     Additional history obtained: -Additional history obtained from family -External records from outside source obtained and reviewed including: Chart review including previous notes, labs, imaging, consultation notes including prior notes    Lab Tests: -I ordered, reviewed, and interpreted labs.   The pertinent results include:   Labs Reviewed  COMPREHENSIVE METABOLIC PANEL WITH GFR - Abnormal; Notable for the following components:      Result Value   Glucose, Bld 101 (*)    All other components within normal limits  CBC - Abnormal; Notable for the following components:   Hemoglobin 11.6 (*)    MCH 25.2 (*)    RDW 16.6 (*)    All other components within normal limits  URINALYSIS, ROUTINE W REFLEX MICROSCOPIC - Abnormal; Notable for the following components:   Color, Urine STRAW (*)    Leukocytes,Ua TRACE (*)    Bacteria,  UA RARE (*)    All other components within normal limits  LIPASE, BLOOD    Notable for mild anemia  EKG   EKG Interpretation Date/Time:  Saturday January 13 2024 11:17:48 EDT Ventricular Rate:  81 PR Interval:  160  QRS Duration:  82 QT Interval:  366 QTC Calculation: 425 R Axis:   -53  Text Interpretation: Normal sinus rhythm Left anterior fascicular block Anterior infarct , age undetermined Abnormal ECG Confirmed by Francesca Fallow ([id number]) on 01/13/2024 12:47:01 PM         Imaging Studies ordered: I ordered imaging studies including CT abd/pelv On my interpretation imaging demonstrates constipation I independently visualized and interpreted imaging. I agree with the radiologist interpretation   Medicines ordered and prescription drug management: Meds ordered this encounter  Medications   sodium chloride  0.9 % bolus 1,000 mL   ondansetron  (ZOFRAN ) injection 4 mg   morphine  (PF) 4 MG/ML injection 4 mg   iohexol  (OMNIPAQUE ) 350 MG/ML injection 75 mL   polyethylene glycol powder (MIRALAX ) 17 GM/SCOOP powder    Sig: Take 17 g by mouth daily.    Dispense:  255 g    Refill:  0   senna-docusate (SENOKOT-S) 8.6-50 MG tablet    Sig: Take 1 tablet by mouth daily.    Dispense:  30 tablet    Refill:  0    -I have reviewed the patients home medicines and have made adjustments as needed   Reevaluation: After the interventions noted above, I reevaluated the patient and found that their symptoms have improved  Co morbidities that complicate the patient evaluation  Past Medical History:  Diagnosis Date   Abdominal pain 02/13/2012   Cough due to ACE inhibitor 05/05/2021   Diabetes mellitus (HCC) 02/15/2012   Dizziness 08/15/2019   Dysuria 05/05/2021   Eye pain, bilateral 06/20/2016   Hematuria 05/12/2021   Hypertension    URI with cough and congestion 06/03/2021   Weight loss 06/03/2020      Dispostion: Disposition decision including need for hospitalization was  considered, and patient discharged from emergency department.    Final Clinical Impression(s) / ED Diagnoses Final diagnoses:  Constipation, unspecified constipation type     This chart was dictated using voice recognition software.  Despite best efforts to proofread,  errors can occur which can change the documentation meaning.    Francesca Fallow CROME, MD 01/13/24 [phone]

## 2024-01-13 NOTE — ED Notes (Signed)
 Patient transported to CT

## 2024-01-13 NOTE — ED Triage Notes (Signed)
 Patient briefly evaluated at Quick Triage window:  Patient presents from home for abd pain and nausea that has been going on for weeks but became much worse last night.   Denies emesis, blood in stool, dizziness, fever.   Arrives ambulatory, breathing unlabored.

## 2024-06-05 ENCOUNTER — Encounter: Payer: Self-pay | Admitting: Family Medicine

## 2024-06-05 ENCOUNTER — Ambulatory Visit: Admitting: Family Medicine

## 2024-06-05 ENCOUNTER — Other Ambulatory Visit: Payer: Self-pay | Admitting: Family Medicine

## 2024-06-05 ENCOUNTER — Ambulatory Visit
Admission: RE | Admit: 2024-06-05 | Discharge: 2024-06-05 | Disposition: A | Source: Ambulatory Visit | Attending: Family Medicine

## 2024-06-05 VITALS — BP 134/74 | HR 100 | Wt 149.4 lb

## 2024-06-05 DIAGNOSIS — E1142 Type 2 diabetes mellitus with diabetic polyneuropathy: Secondary | ICD-10-CM | POA: Diagnosis not present

## 2024-06-05 DIAGNOSIS — R051 Acute cough: Secondary | ICD-10-CM | POA: Diagnosis not present

## 2024-06-05 DIAGNOSIS — J439 Emphysema, unspecified: Secondary | ICD-10-CM | POA: Diagnosis not present

## 2024-06-05 LAB — POC SOFIA 2 FLU + SARS ANTIGEN FIA
Influenza A, POC: NEGATIVE
Influenza B, POC: NEGATIVE
SARS Coronavirus 2 Ag: NEGATIVE

## 2024-06-05 LAB — POCT GLYCOSYLATED HEMOGLOBIN (HGB A1C): HbA1c, POC (controlled diabetic range): 6.7 % (ref 0.0–7.0)

## 2024-06-05 MED ORDER — UMECLIDINIUM-VILANTEROL 62.5-25 MCG/ACT IN AEPB: 1.0000 | INHALATION_SPRAY | Freq: Every day | RESPIRATORY_TRACT | 2 refills | Status: AC

## 2024-06-05 MED ORDER — FLUTICASONE PROPIONATE 50 MCG/ACT NA SUSP: 2.0000 | Freq: Every day | NASAL | 0 refills | Status: DC

## 2024-06-05 MED ORDER — PREDNISONE 20 MG PO TABS: 40.0000 mg | ORAL_TABLET | Freq: Every day | ORAL | 0 refills | Status: AC

## 2024-06-05 MED ORDER — AZITHROMYCIN 500 MG PO TABS: 500.0000 mg | ORAL_TABLET | Freq: Every day | ORAL | 0 refills | Status: AC

## 2024-06-05 MED ORDER — ALBUTEROL SULFATE HFA 108 (90 BASE) MCG/ACT IN AERS: 2.0000 | INHALATION_SPRAY | Freq: Four times a day (QID) | RESPIRATORY_TRACT | 2 refills | Status: AC | PRN

## 2024-06-05 MED ORDER — CETIRIZINE HCL 10 MG PO TABS: 10.0000 mg | ORAL_TABLET | Freq: Every day | ORAL | 11 refills | Status: AC

## 2024-06-05 NOTE — Patient Instructions (Addendum)
 It was wonderful to see you today.  Please bring ALL of your medications with you to every visit.   Today we talked about:  Cough:  I prescribed antibiotics for 3 days Please take the steroid (prednisone ) for 5 days Continue albuterol  as needed Treat allergies with zyrtec  and flonase  Get a chest xray at [facility] imaging    [facility] [place] : [address]  [phone]  Thank you for choosing [facility].   Please call [phone] with any questions about today's appointment.  Please arrive at least 15 minutes prior to your scheduled appointments.   If you had blood work today, I will send you a MyChart message or a letter if results are normal. Otherwise, I will give you a call.   If you had a referral placed, they will call you to set up an appointment. Please give us  a call if you don't hear back in the next 2 weeks.   If you need additional refills before your next appointment, please call your pharmacy first.   Do you need your medications delivered to your home?   Well send your prescription to the [facility] for delivery.          Address: [address] [place], [place], [place] [address]          Phone: [phone]  Please call the [facility] to speak with a pharmacist and set up your home medication delivery. If you have any questions, feel free to contact us  -- were happy to help!  Other [facility] that offer affordable prices on both prescriptions and over-the-counter items, as well as convenient services like vaccinations, are  [facility], at [facility]         Address:  [address], [place], [place] [address]         Phone: [phone]  [facility], located in the [facility]        Address: [address], [place], [place] [address]        Phone: [phone]  [facility], at [facility]       Address:  [address], [place], [place] [address]       Phone: [phone]  [facility], at [facility]       Address: [address], [address] Floor, [place], [place] [address]       Phone: [phone]  You should follow up in our clinic in 1 week   Gloriann Ogren, MD Family Medicine

## 2024-06-05 NOTE — Progress Notes (Signed)
" ° ° °  SUBJECTIVE:   CHIEF COMPLAINT / HPI:   Reports 2 weeks of ongoing cough, itchy throat and increased SOB. Reports she felt warm in the beginning but has not had fevers since. Thinks she dose have increased shortness of breath but more concerned about her throat and difficulty swallowing. Denies increased phlegm production. Denies chest pain. Denies N/V. Denies headaches.  She has not been taking her anoro or albuterol . Has not tried anything to help   PERTINENT  PMH / PSH:  Emphysema   OBJECTIVE:   BP 134/74   Pulse 100   Wt 149 lb 6.4 oz (67.8 kg)   SpO2 96%   BMI 26.47 kg/m   General: A&O, NAD HEENT: No sign of trauma, EOM grossly intact Cardiac: RRR, no m/r/g Respiratory: bilateral crackles with end expiratory wheezing throughout, ambulating well on RA, no increased SOB GI: Soft, NTTP, non-distended  Extremities: NTTP, no peripheral edema. Neuro: Normal gait, moves all four extremities appropriately. Psych: Appropriate mood and affect   ASSESSMENT/PLAN:   Assessment & Plan Acute cough Pulmonary emphysema (HCC) Amb pulse ox between 94-96% on RA. Flu and COVID negative. Patient does have history of COPD, suspect COPD exacerbation 2/2 to viral infection. No focal breath sounds on exam, less concerning for pneumonia but will treat as COPD exacerbation.  - azithromycin  500 mg daily for 3 days - 5 day course prednisone  40 mg daily - restart anoro ellipta  , albuterol  prn - flonase  and cetrizine for itchy throat  - chest xray ordered  - one week follow up  Type 2 diabetes mellitus with diabetic polyneuropathy, without long-term current use of insulin  (HCC) A1C today 6.7. Attempted UACR but unable to provide urine. Currently taking metformin  1000mg  BID. - UACR at next visit - continue metformin  1000mg  BID, patient A1C at goal <7   1 week follow up   Gloriann Ogren, MD [facility] "

## 2024-06-05 NOTE — Assessment & Plan Note (Signed)
 A1C today 6.7. Attempted UACR but unable to provide urine. Currently taking metformin  1000mg  BID. - UACR at next visit - continue metformin  1000mg  BID, patient A1C at goal <7

## 2024-06-11 ENCOUNTER — Ambulatory Visit: Payer: Self-pay | Admitting: Family Medicine

## 2024-06-11 NOTE — Telephone Encounter (Signed)
 Called to inform re patients xray, which was normal. Martha Gillespie picked up phone, reports she would not like an interpreter and will forward the message to the patient.

## 2024-06-12 ENCOUNTER — Ambulatory Visit: Payer: Self-pay | Admitting: Family Medicine

## 2024-06-18 ENCOUNTER — Other Ambulatory Visit: Payer: Self-pay | Admitting: Family Medicine
